# Patient Record
Sex: Female | Born: 1995 | Race: Black or African American | Hispanic: No | Marital: Single | Smoking: Never smoker
Health system: Southern US, Community
[De-identification: ages and names within clinical notes are randomized; demographics above are authoritative.]

## PROBLEM LIST (undated history)

## (undated) DIAGNOSIS — J45909 Unspecified asthma, uncomplicated: Secondary | ICD-10-CM

## (undated) DIAGNOSIS — R87629 Unspecified abnormal cytological findings in specimens from vagina: Secondary | ICD-10-CM

## (undated) HISTORY — PX: TONSILLECTOMY: SUR1361

## (undated) HISTORY — DX: Unspecified abnormal cytological findings in specimens from vagina: R87.629

---

## 2016-09-27 ENCOUNTER — Encounter (HOSPITAL_COMMUNITY): Payer: Self-pay | Admitting: Emergency Medicine

## 2016-09-27 ENCOUNTER — Ambulatory Visit (HOSPITAL_COMMUNITY)
Admission: EM | Admit: 2016-09-27 | Discharge: 2016-09-27 | Disposition: A | Discharge status: Home / Self Care | Attending: Family Medicine | Admitting: Family Medicine

## 2016-09-27 DIAGNOSIS — J029 Acute pharyngitis, unspecified: Secondary | ICD-10-CM

## 2016-09-27 MED ORDER — AMOXICILLIN 500 MG PO CAPS: 1000.0000 mg | ORAL_CAPSULE | Freq: Two times a day (BID) | ORAL | 0 refills | Status: DC

## 2016-09-27 NOTE — ED Triage Notes (Signed)
[facility] with a complaint of a sore throat x 3 days. The patient reported no fever at home.

## 2016-09-27 NOTE — ED Provider Notes (Signed)
CSN: [id number]     Arrival date & time 09/27/16  1005 History   First MD Initiated Contact with Patient 09/27/16 1048     Chief Complaint  Patient presents with  . Sore Throat   (Consider location/radiation/quality/duration/timing/severity/associated sxs/prior Treatment) 20 year old female complaining of a sore throat for 3 days. Denies cough, PND or known fever.      History reviewed. No pertinent past medical history. History reviewed. No pertinent surgical history. History reviewed. No pertinent family history. Social History  Substance Use Topics  . Smoking status: Never Smoker  . Smokeless tobacco: Never Used  . Alcohol use No   OB History    No data available     Review of Systems  Constitutional: Positive for activity change and fatigue. Negative for fever.  HENT: Negative for congestion, ear pain, postnasal drip and rhinorrhea.   Eyes: Negative.   Respiratory: Negative for cough and chest tightness.   Cardiovascular: Negative for chest pain and leg swelling.  Skin: Negative.  Negative for rash.  Neurological: Negative.     Allergies  Patient has no known allergies.  Home Medications   Prior to Admission medications   Medication Sig Start Date End Date Taking? Authorizing Provider  amoxicillin (AMOXIL) 500 MG capsule Take 2 capsules (1,000 mg total) by mouth 2 (two) times daily. 09/27/16   Hayden Rasmussenavid Kacy Conely, NP   Meds Ordered and Administered this Visit  Medications - No data to display  BP 117/83 (BP Location: Right Arm)   Pulse 115   Temp 100.2 F (37.9 C) (Oral)   Resp 17   LMP 09/25/2016 (Exact Date)   SpO2 98%  No data found.   Physical Exam  Constitutional: She is oriented to person, place, and time. She appears well-developed and well-nourished. No distress.  HENT:  Head: Normocephalic and atraumatic.  Mouth/Throat: Oropharyngeal exudate present.  Bilateral TMs are mildly retracted however no erythema or effusion.  Oropharynx with generalized  erythema, multiple exudates and mildly enlarged palatine tonsils.  Eyes: EOM are normal.  Neck: Normal range of motion. Neck supple.  Cardiovascular: Normal rate, regular rhythm and normal heart sounds.   Pulmonary/Chest: Breath sounds normal. She is in respiratory distress. She has no wheezes.  Musculoskeletal: Normal range of motion.  Lymphadenopathy:    She has cervical adenopathy.  Neurological: She is alert and oriented to person, place, and time.  Skin: Skin is warm and dry. No rash noted.  Nursing note and vitals reviewed.   Urgent Care Course   Clinical Course     Procedures (including critical care time)  Labs Review Labs Reviewed - No data to display  Imaging Review No results found.   Visual Acuity Review  Right Eye Distance:   Left Eye Distance:   Bilateral Distance:    Right Eye Near:   Left Eye Near:    Bilateral Near:         MDM   1. Exudative pharyngitis    You are being given amoxicillin for your sore throat. Drink plenty of fluids, stay well-hydrated, use Cepacol lozenges to help numb the throat. Take ibuprofen 600 mg every 6 hours as needed. Return instructions on sore throat. For worsening, new symptoms or problems may return. Meds ordered this encounter  Medications  . amoxicillin (AMOXIL) 500 MG capsule    Sig: Take 2 capsules (1,000 mg total) by mouth 2 (two) times daily.    Dispense:  30 capsule    Refill:  0    Order  Specific Question:   Supervising Provider    Answer:   Elvina SidleLAUENSTEIN, KURT [[id number]]       Hayden Rasmussenavid Sidnie Swalley, NP 09/27/16 1058

## 2016-09-27 NOTE — Discharge Instructions (Signed)
You are being given amoxicillin for your sore throat. Drink plenty of fluids, stay well-hydrated, use Cepacol lozenges to help numb the throat. Take ibuprofen 600 mg every 6 hours as needed. Return instructions on sore throat. For worsening, new symptoms or problems may return.

## 2016-11-18 ENCOUNTER — Ambulatory Visit (HOSPITAL_COMMUNITY)
Admission: EM | Admit: 2016-11-18 | Discharge: 2016-11-18 | Disposition: A | Discharge status: Home / Self Care | Attending: Family Medicine | Admitting: Family Medicine

## 2016-11-18 ENCOUNTER — Encounter (HOSPITAL_COMMUNITY): Payer: Self-pay | Admitting: Emergency Medicine

## 2016-11-18 DIAGNOSIS — Z79899 Other long term (current) drug therapy: Secondary | ICD-10-CM | POA: Insufficient documentation

## 2016-11-18 DIAGNOSIS — B9689 Other specified bacterial agents as the cause of diseases classified elsewhere: Secondary | ICD-10-CM | POA: Insufficient documentation

## 2016-11-18 DIAGNOSIS — N760 Acute vaginitis: Secondary | ICD-10-CM | POA: Diagnosis not present

## 2016-11-18 LAB — POCT URINALYSIS DIP (DEVICE)
Bilirubin Urine: NEGATIVE
Glucose, UA: NEGATIVE mg/dL
Ketones, ur: NEGATIVE mg/dL
LEUKOCYTES UA: NEGATIVE
NITRITE: NEGATIVE
PROTEIN: NEGATIVE mg/dL
SPECIFIC GRAVITY, URINE: 1.02 (ref 1.005–1.030)
UROBILINOGEN UA: 0.2 mg/dL (ref 0.0–1.0)
pH: 6 (ref 5.0–8.0)

## 2016-11-18 LAB — POCT PREGNANCY, URINE: Preg Test, Ur: NEGATIVE

## 2016-11-18 MED ORDER — METRONIDAZOLE 500 MG PO TABS: 500.0000 mg | ORAL_TABLET | Freq: Two times a day (BID) | ORAL | 0 refills | Status: DC

## 2016-11-18 NOTE — Discharge Instructions (Signed)
Take 1 Diflucan when you get home

## 2016-11-18 NOTE — ED Provider Notes (Signed)
[facility]    CSN: [id number] Arrival date & time: 11/18/16  1435     History   Chief Complaint Chief Complaint  Patient presents with  . Vaginal Discharge    HPI Sarah [name] is a 21 y.o. female.   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 She describes this as watery and green. She's having essentially no abdominal pain or fever.      History reviewed. No pertinent past medical history.  There are no active problems to display for this patient.   History reviewed. No pertinent surgical history.  OB History    No data available       Home Medications    Prior to Admission medications   Medication Sig Start Date End Date Taking? Authorizing Provider  albuterol (PROVENTIL HFA;VENTOLIN HFA) 108 (90 Base) MCG/ACT inhaler Inhale into the lungs every 6 (six) hours as needed for wheezing or shortness of breath.   Yes Historical Provider, MD  loratadine (CLARITIN) 10 MG tablet Take 10 mg by mouth daily.   Yes Historical Provider, MD  montelukast (SINGULAIR) 10 MG tablet Take 10 mg by mouth at bedtime.   Yes Historical Provider, MD  metroNIDAZOLE (FLAGYL) 500 MG tablet Take 1 tablet (500 mg total) by mouth 2 (two) times daily. 11/18/16   Elvina Sidle, MD    Family History History reviewed. No pertinent family history.  Social History Social History  Substance Use Topics  . Smoking status: Never Smoker  . Smokeless tobacco: Never Used  . Alcohol use No     Allergies   Patient has no known allergies.   Review of Systems Review of Systems  Constitutional: Negative.   HENT: Negative.   Respiratory: Negative.   Gastrointestinal: Negative.   Genitourinary: Positive for vaginal discharge. Negative for dyspareunia, dysuria and pelvic pain.     Physical Exam Triage Vital Signs ED Triage Vitals  Enc Vitals  Group     BP      Pulse      Resp      Temp      Temp src      SpO2      Weight      Height      Head Circumference      Peak Flow      Pain Score      Pain Loc      Pain Edu?      Excl. in GC?    No data found.   Updated Vital Signs LMP 11/06/2016    Physical Exam  Constitutional: She is oriented to person, place, and time. She appears well-developed and well-nourished.  HENT:  Right Ear: External ear normal.  Left Ear: External ear normal.  Mouth/Throat: Oropharynx is clear and moist.  Eyes: Conjunctivae are normal.  Neck: Normal range of motion. Neck supple.  Pulmonary/Chest: Effort normal.  Genitourinary: Vaginal discharge found.  Genitourinary Comments: No cervical motion tenderness, minimal discharge, no external genitalia abnormalities  Musculoskeletal: Normal range of motion.  Neurological: She is alert and oriented to person, place, and time.  Skin: Skin is warm and dry.  Nursing note and vitals reviewed.    UC Treatments / Results  Labs (all labs ordered are listed, but only abnormal results are displayed) Labs Reviewed  POCT URINALYSIS DIP (DEVICE) - Abnormal; Notable for the following:  Result Value   Hgb urine dipstick TRACE (*)    All other components within normal limits  POCT PREGNANCY, URINE  CERVICOVAGINAL ANCILLARY ONLY    EKG  EKG Interpretation None       Radiology No results found.  Procedures Procedures (including critical care time)  Medications Ordered in UC Medications - No data to display   Initial Impression / Assessment and Plan / UC Course  I have reviewed the triage vital signs and the nursing notes.  Pertinent labs & imaging results that were available during my care of the patient were reviewed by me and considered in my medical decision making (see chart for details).    Final Clinical Impressions(s) / UC Diagnoses   Final diagnoses:  BV (bacterial vaginosis)    New Prescriptions New  Prescriptions   METRONIDAZOLE (FLAGYL) 500 MG TABLET    Take 1 tablet (500 mg total) by mouth 2 (two) times daily.     Elvina SidleKurt Pernella Ackerley, MD 11/18/16 [phone]

## 2016-11-18 NOTE — ED Notes (Signed)
Verified phone number 

## 2016-11-18 NOTE — ED Triage Notes (Signed)
Here for yellowish vag d/c onset 1 month  Denies urinary sx, fevers, n/v, abd pain  SA in a monogamous relationship x2 years w/occasional condom use  A&O x4... NAD

## 2016-11-19 LAB — CERVICOVAGINAL ANCILLARY ONLY
Chlamydia: NEGATIVE
Neisseria Gonorrhea: NEGATIVE
Wet Prep (BD Affirm): POSITIVE — AB

## 2017-02-23 ENCOUNTER — Ambulatory Visit (HOSPITAL_COMMUNITY)
Admission: EM | Admit: 2017-02-23 | Discharge: 2017-02-23 | Disposition: A | Discharge status: Home / Self Care | Attending: Internal Medicine | Admitting: Internal Medicine

## 2017-02-23 ENCOUNTER — Encounter (HOSPITAL_COMMUNITY): Payer: Self-pay | Admitting: Family Medicine

## 2017-02-23 DIAGNOSIS — B349 Viral infection, unspecified: Secondary | ICD-10-CM | POA: Diagnosis not present

## 2017-02-23 DIAGNOSIS — M542 Cervicalgia: Secondary | ICD-10-CM

## 2017-02-23 DIAGNOSIS — M436 Torticollis: Secondary | ICD-10-CM

## 2017-02-23 NOTE — ED Triage Notes (Signed)
Pt here for cervical pain x 1 week. Denies injury. sts worse with movement. Pt in no acute distress.

## 2017-02-23 NOTE — ED Provider Notes (Signed)
[facility]    CSN: [id number] Arrival date & time: 02/23/17  1002     History   Chief Complaint Chief Complaint  Patient presents with  . Neck Pain    HPI Sarah [name] is a 21 y.o. female. She presents today with about a one-week history of stiffness and discomfort in the back of her neck. This is not getting worse, just isn't going away. A relative was recently diagnosed with meningitis, about 10 days ago, and she wanted to be checked. Relative was managed with ER diagnosis and discharge.  No fever. Slight headache, intermittent. Has not missed class. Taking finals, has been at the computer more often than usual. No unusual activities or trauma. No rash, no unusual joint pains or achiness elsewhere. Equivocal sore throat. No runny/congested nose, no cough. Decreased appetite but no nausea/vomiting. Has had some loose stools, which is not unusual for her.    HPI  History reviewed. No pertinent past medical history.  History reviewed. No pertinent surgical history.    Home Medications    Prior to Admission medications   Medication Sig Start Date End Date Taking? Authorizing Provider  albuterol (PROVENTIL HFA;VENTOLIN HFA) 108 (90 Base) MCG/ACT inhaler Inhale into the lungs every 6 (six) hours as needed for wheezing or shortness of breath.    [provider]  loratadine (CLARITIN) 10 MG tablet Take 10 mg by mouth daily.    [provider]  metroNIDAZOLE (FLAGYL) 500 MG tablet Take 1 tablet (500 mg total) by mouth 2 (two) times daily. 11/18/16   Elvina SidleLauenstein, Kurt, MD  montelukast (SINGULAIR) 10 MG tablet Take 10 mg by mouth at bedtime.    [provider]    Family History History reviewed. No pertinent family history.  Social History Social History  Substance Use Topics  . Smoking status: Never Smoker  . Smokeless tobacco: Never Used  . Alcohol use No     Allergies   Patient has no known allergies.   Review of  Systems Review of Systems  All other systems reviewed and are negative.    Physical Exam Triage Vital Signs ED Triage Vitals [02/23/17 1025]  Enc Vitals Group     BP 113/65     Pulse Rate 77     Resp 16     Temp 98.3 F (36.8 C)     Temp src      SpO2 97 %     Weight      Height      Pain Score 7     Pain Loc    Updated Vital Signs BP 113/65   Pulse 77   Temp 98.3 F (36.8 C)   Resp 16   SpO2 97%   Physical Exam  Constitutional: She is oriented to person, place, and time. No distress.  HENT:  Head: Atraumatic.  Bilateral TMs are translucent, no erythema No significant nasal congestion Throat is slightly injected, tonsils are not enlarged  Eyes:  Conjugate gaze observed, no eye redness/discharge  Neck: Neck supple.  Able to fully flex neck forward, but it is uncomfortable. Range of motion is otherwise full and pain free.  Cardiovascular: Normal rate and regular rhythm.   Pulmonary/Chest: No respiratory distress. She has no wheezes. She has no rales.  Abdominal: She exhibits no distension.  Musculoskeletal: Normal range of motion.  Neurological: She is alert and oriented to person, place, and time.  Skin: Skin is warm and dry.  Nursing note and vitals reviewed.  UC Treatments / Results   Procedures Procedures (including critical care time) None today  Final Clinical Impressions(s) / UC Diagnoses   Final diagnoses:  Nonspecific syndrome suggestive of viral illness  Acute muscle stiffness of neck   No danger signs on exam.  Anticipate gradual improvement over the next week or so in neck discomfort.  Ibuprofen 3-4 tabs 3 times daily should help with pain.  Ice for 5-10 minutes several times daily should also help.  Recheck for fever >100.5, marked increase in neck pain/stiffness, new rash, new joint pains/swelling.     Eustace Moore, MD 02/27/17 [phone]

## 2017-02-23 NOTE — Discharge Instructions (Addendum)
No danger signs on exam.  Anticipate gradual improvement over the next week or so in neck discomfort.  Ibuprofen 3-4 tabs 3 times daily should help with pain.  Ice for 5-10 minutes several times daily should also help.  Recheck for fever >100.5, marked increase in neck pain/stiffness, new rash, new joint pains/swelling.

## 2018-08-21 ENCOUNTER — Ambulatory Visit (HOSPITAL_COMMUNITY)
Admission: EM | Admit: 2018-08-21 | Discharge: 2018-08-21 | Disposition: A | Discharge status: Home / Self Care | Attending: Family Medicine | Admitting: Family Medicine

## 2018-08-21 ENCOUNTER — Encounter (HOSPITAL_COMMUNITY): Payer: Self-pay | Admitting: Emergency Medicine

## 2018-08-21 DIAGNOSIS — M542 Cervicalgia: Secondary | ICD-10-CM | POA: Diagnosis not present

## 2018-08-21 DIAGNOSIS — M7918 Myalgia, other site: Secondary | ICD-10-CM | POA: Diagnosis not present

## 2018-08-21 MED ORDER — TIZANIDINE HCL 4 MG PO TABS: 4.0000 mg | ORAL_TABLET | Freq: Four times a day (QID) | ORAL | 0 refills | Status: DC | PRN

## 2018-08-21 MED ORDER — IBUPROFEN 800 MG PO TABS: 800.0000 mg | ORAL_TABLET | Freq: Three times a day (TID) | ORAL | 0 refills | Status: DC

## 2018-08-21 NOTE — ED Triage Notes (Signed)
Pt c/o neck pain, states when she turns to look to the left it hurts. Pain x1 month.

## 2018-08-21 NOTE — ED Provider Notes (Signed)
[facility]    CSN: [id number] Arrival date & time: 08/21/18  1206     History   Chief Complaint Chief Complaint  Patient presents with  . Neck Pain    HPI Sarah [name] is a 22 y.o. female.   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  She is here for neck pain.  Is only on the left side of her neck.  She states she "slept wrong".  Been present for a month.  She went to her PCP.  They ordered physical therapy.  She never went.,  Had to come back to school.  The pain is moderate.  Hurts worse with movement.  It does make it difficult to sleep at night.  No numbness or weakness in arms or legs.  No prior history of neck problems.  No trauma or accident.  Sometimes it causes her to have a headache in the back of her head.  History reviewed. No pertinent past medical history.  There are no active problems to display for this patient.   History reviewed. No pertinent surgical history.  OB History   None      Home Medications    Prior to Admission medications   Medication Sig Start Date End Date Taking? Authorizing Provider  albuterol (PROVENTIL HFA;VENTOLIN HFA) 108 (90 Base) MCG/ACT inhaler Inhale into the lungs every 6 (six) hours as needed for wheezing or shortness of breath.    [provider]  ibuprofen (ADVIL,MOTRIN) 800 MG tablet Take 1 tablet (800 mg total) by mouth 3 (three) times daily. 08/21/18   Eustace Moore, MD  loratadine (CLARITIN) 10 MG tablet Take 10 mg by mouth daily.    [provider]  montelukast (SINGULAIR) 10 MG tablet Take 10 mg by mouth at bedtime.    [provider]  tiZANidine (ZANAFLEX) 4 MG tablet Take 1 tablet (4 mg total) by mouth every 6 (six) hours as needed for muscle spasms. 08/21/18   Eustace Moore, MD    Family History Family History  Problem Relation Age of Onset  . Healthy Mother   . Healthy Father     Social History Social History   Tobacco Use  . Smoking status:  Never Smoker  . Smokeless tobacco: Never Used  Substance Use Topics  . Alcohol use: No  . Drug use: No     Allergies   Patient has no known allergies.   Review of Systems Review of Systems  Constitutional: Negative for chills and fever.  HENT: Negative for ear pain and sore throat.   Eyes: Negative for pain and visual disturbance.  Respiratory: Negative for cough and shortness of breath.   Cardiovascular: Negative for chest pain and palpitations.  Gastrointestinal: Negative for abdominal pain and vomiting.  Genitourinary: Negative for dysuria and hematuria.  Musculoskeletal: Positive for neck pain and neck stiffness. Negative for arthralgias and back pain.  Skin: Negative for color change and rash.  Neurological: Negative for seizures, syncope, weakness and numbness.  All other systems reviewed and are negative.    Physical Exam Triage Vital Signs ED Triage Vitals  Enc Vitals Group     BP 08/21/18 1321 120/86     Pulse Rate 08/21/18 1321 72     Resp 08/21/18 1321 16     Temp 08/21/18 1321 98.2 F (36.8 C)     Temp src --      SpO2 08/21/18 1321 100 %     Weight --  Height --      Head Circumference --      Peak Flow --      Pain Score 08/21/18 1322 7     Pain Loc --      Pain Edu? --      Excl. in GC? --    No data found.  Updated Vital Signs BP 120/86   Pulse 72   Temp 98.2 F (36.8 C)   Resp 16   LMP 07/21/2018   SpO2 100%       Physical Exam  Constitutional: She appears well-developed and well-nourished. No distress.  HENT:  Head: Normocephalic and atraumatic.  Mouth/Throat: Oropharynx is clear and moist.  Eyes: Pupils are equal, round, and reactive to light. Conjunctivae are normal.  Neck: Normal range of motion.  Appears mildly uncomfortable.  Has limited rotation towards the left.  Otherwise range of motion is slow but full  Cardiovascular: Normal rate.  Pulmonary/Chest: Effort normal. No respiratory distress.  Abdominal: Soft. She  exhibits no distension.  Musculoskeletal: Normal range of motion. She exhibits no edema.  Strength sensation range of motion and reflexes are symmetric in both upper extremities  Neurological: She is alert.  Skin: Skin is warm and dry.  Psychiatric: She has a normal mood and affect. Her behavior is normal.     UC Treatments / Results  Labs (all labs ordered are listed, but only abnormal results are displayed) Labs Reviewed - No data to display  EKG None  Radiology No results found.  Procedures Procedures (including critical care time)  Medications Ordered in UC Medications - No data to display  Initial Impression / Assessment and Plan / UC Course  I have reviewed the triage vital signs and the nursing notes.  Pertinent labs & imaging results that were available during my care of the patient were reviewed by me and considered in my medical decision making (see chart for details).     Recommend conservative treatment of neck pain.  This is muscular pain.  No evidence of nerve inflammation.  No history of trauma.  X-rays not indicated.  Will treat with medication.  Demonstrated range of motion exercises.  Return as needed Final Clinical Impressions(s) / UC Diagnoses   Final diagnoses:  Musculoskeletal pain  Neck pain     Discharge Instructions     Take ibuprofen 3 times a day with food. In addition take muscle relaxer as needed.  Muscle relaxer is useful at bedtime.  Caution drowsiness, driving if taking the muscle relaxer. Use ice or heat to the neck muscles. Gentle range of motion and stretching.  Do this twice a day. Follow-up with your PCP if not improving in a week or 2   ED Prescriptions    Medication Sig Dispense Auth. Provider   ibuprofen (ADVIL,MOTRIN) 800 MG tablet Take 1 tablet (800 mg total) by mouth 3 (three) times daily. 21 tablet Eustace Moore, MD   tiZANidine (ZANAFLEX) 4 MG tablet Take 1 tablet (4 mg total) by mouth every 6 (six) hours as  needed for muscle spasms. 30 tablet Eustace Moore, MD     Controlled Substance Prescriptions [place] Controlled Substance Registry consulted? Not Applicable   Eustace Moore, MD 08/21/18 1344

## 2018-08-21 NOTE — Discharge Instructions (Signed)
Take ibuprofen 3 times a day with food. In addition take muscle relaxer as needed.  Muscle relaxer is useful at bedtime.  Caution drowsiness, driving if taking the muscle relaxer. Use ice or heat to the neck muscles. Gentle range of motion and stretching.  Do this twice a day. Follow-up with your PCP if not improving in a week or 2

## 2020-05-07 ENCOUNTER — Emergency Department (HOSPITAL_COMMUNITY): Payer: 59

## 2020-05-07 ENCOUNTER — Encounter (HOSPITAL_COMMUNITY): Payer: Self-pay | Admitting: Emergency Medicine

## 2020-05-07 ENCOUNTER — Other Ambulatory Visit: Payer: Self-pay | Admitting: Family Medicine

## 2020-05-07 ENCOUNTER — Emergency Department (HOSPITAL_COMMUNITY)
Admission: EM | Admit: 2020-05-07 | Discharge: 2020-05-07 | Disposition: A | Discharge status: Home / Self Care | Payer: 59 | Attending: Emergency Medicine | Admitting: Emergency Medicine

## 2020-05-07 ENCOUNTER — Other Ambulatory Visit: Payer: Self-pay

## 2020-05-07 DIAGNOSIS — Z79899 Other long term (current) drug therapy: Secondary | ICD-10-CM | POA: Diagnosis not present

## 2020-05-07 DIAGNOSIS — N760 Acute vaginitis: Secondary | ICD-10-CM | POA: Diagnosis not present

## 2020-05-07 DIAGNOSIS — R102 Pelvic and perineal pain: Secondary | ICD-10-CM | POA: Diagnosis not present

## 2020-05-07 DIAGNOSIS — J45909 Unspecified asthma, uncomplicated: Secondary | ICD-10-CM | POA: Diagnosis not present

## 2020-05-07 DIAGNOSIS — O30041 Twin pregnancy, dichorionic/diamniotic, first trimester: Secondary | ICD-10-CM

## 2020-05-07 DIAGNOSIS — O3680X0 Pregnancy with inconclusive fetal viability, not applicable or unspecified: Secondary | ICD-10-CM

## 2020-05-07 DIAGNOSIS — Z3A01 Less than 8 weeks gestation of pregnancy: Secondary | ICD-10-CM | POA: Diagnosis not present

## 2020-05-07 DIAGNOSIS — O99891 Other specified diseases and conditions complicating pregnancy: Secondary | ICD-10-CM | POA: Diagnosis not present

## 2020-05-07 DIAGNOSIS — R109 Unspecified abdominal pain: Secondary | ICD-10-CM

## 2020-05-07 DIAGNOSIS — O99511 Diseases of the respiratory system complicating pregnancy, first trimester: Secondary | ICD-10-CM | POA: Insufficient documentation

## 2020-05-07 HISTORY — DX: Unspecified asthma, uncomplicated: J45.909

## 2020-05-07 LAB — URINALYSIS, ROUTINE W REFLEX MICROSCOPIC
Bacteria, UA: NONE SEEN
Bilirubin Urine: NEGATIVE
Glucose, UA: NEGATIVE mg/dL
Ketones, ur: 80 mg/dL — AB
Leukocytes,Ua: NEGATIVE
Nitrite: NEGATIVE
Protein, ur: NEGATIVE mg/dL
Specific Gravity, Urine: 1.016 (ref 1.005–1.030)
pH: 5 (ref 5.0–8.0)

## 2020-05-07 LAB — COMPREHENSIVE METABOLIC PANEL
ALT: 14 U/L (ref 0–44)
AST: 15 U/L (ref 15–41)
Albumin: 4.7 g/dL (ref 3.5–5.0)
Alkaline Phosphatase: 49 U/L (ref 38–126)
Anion gap: 9 (ref 5–15)
BUN: 10 mg/dL (ref 6–20)
CO2: 26 mmol/L (ref 22–32)
Calcium: 9.6 mg/dL (ref 8.9–10.3)
Chloride: 104 mmol/L (ref 98–111)
Creatinine, Ser: 0.67 mg/dL (ref 0.44–1.00)
GFR calc Af Amer: >60 mL/min (ref >60)
GFR calc non Af Amer: >60 mL/min (ref >60)
Glucose, Bld: 80 mg/dL (ref 70–99)
Potassium: 3.7 mmol/L (ref 3.5–5.1)
Sodium: 139 mmol/L (ref 135–145)
Total Bilirubin: 1 mg/dL (ref 0.3–1.2)
Total Protein: 7.6 g/dL (ref 6.5–8.1)

## 2020-05-07 LAB — CBC
HCT: 40.3 % (ref 36.0–46.0)
Hemoglobin: 13.5 g/dL (ref 12.0–15.0)
MCH: 29.7 pg (ref 26.0–34.0)
MCHC: 33.5 g/dL (ref 30.0–36.0)
MCV: 88.6 fL (ref 80.0–100.0)
Platelets: 345 10*3/uL (ref 150–400)
RBC: 4.55 MIL/uL (ref 3.87–5.11)
RDW: 13.1 % (ref 11.5–15.5)
WBC: 6.4 10*3/uL (ref 4.0–10.5)
nRBC: 0 % (ref 0.0–0.2)

## 2020-05-07 LAB — WET PREP, GENITAL
Sperm: NONE SEEN
Trich, Wet Prep: NEGATIVE — AB
Yeast Wet Prep HPF POC: NEGATIVE — AB

## 2020-05-07 LAB — I-STAT BETA HCG BLOOD, ED (MC, WL, AP ONLY): I-stat hCG, quantitative: >2000 m[IU]/mL — ABNORMAL HIGH (ref <5)

## 2020-05-07 LAB — LIPASE, BLOOD: Lipase: 24 U/L (ref 11–51)

## 2020-05-07 LAB — HCG, QUANTITATIVE, PREGNANCY: hCG, Beta Chain, Quant, S: 15366 m[IU]/mL — ABNORMAL HIGH (ref <5)

## 2020-05-07 MED ORDER — SODIUM CHLORIDE 0.9% FLUSH: 3.0000 mL | Freq: Once | INTRAVENOUS | Status: DC

## 2020-05-07 MED ORDER — METRONIDAZOLE 500 MG PO TABS
500.0000 mg | ORAL_TABLET | Freq: Two times a day (BID) | ORAL | 0 refills | Status: DC
Start: 2020-05-07 — End: 2020-05-19

## 2020-05-07 NOTE — Progress Notes (Signed)
   OB/GYN Telephone Consult  05/07/20   Sarah [name] is a 24 y.o. G1P0 currently at [redacted]w[redacted]d presenting to [facility]. I was called for a consult regarding the care of this patient by [facility]                                           .    The provider had a clinical question about questions about follow up for pregnancy and ovarian cyst in the setting of mild abdominal cramping  The provider presented the following relevant clinical information: - bHCG - [place] results  I performed a chart review on the patient and reviewed available documentation.  There were no vitals taken for this visit.  Exam- performed by consulting provider   Recommendations:  -Recommend ER provider get quantitative bHCG -Repeat bHCG at [facility] Friday AM -Repeat US in 10-14 days to review pregnancy progression -Ovarian cyst-- seen on [place] is larger than expected for a corpus luteum cyst. Recommended torsion protocol and counseling given large size. Could be hemorrhagic cyst.  -Recommended APP provide the patient with a referral to the [facility] (any office) for follow up in  4 weeks to start prenatal care - I am personally messaging the [facility] to have the patient come for RN visit with stat bHCG on Friday  Thank you for this consult and if additional recommendations are needed please call [phone] for the OB/GYN attending on service at [facility].   I spent approximately 5 minutes directly consulting with the provider and verbally discussing this case. Additionally 5 minutes minutes was spent performing chart review and documentation.   Federico Flake, MD   Criteria for phone consult billing? (If answer to any of these are yes then you cannot bill this telephone consult) Will the patient be seen urgently (within 24hrs) at a [facility] practice? No Is this a patient on which I performed surgery within the last 7d? No Have you billed a  telephone consult on this patient in the last 7d? No  CPT Code (based on total time, direct and indirect) 99441= 5-10 min

## 2020-05-07 NOTE — ED Notes (Addendum)
Pt states minor cramping in pregnancy. Pt states she went to a women's health facility, who told her one of her eggs had no yolk, other one did have yolk. Denies any severe cramping or pain. Pt states she was encouraged to come for second opinion.

## 2020-05-07 NOTE — ED Triage Notes (Signed)
Pt complaint of 7/10 abdominal cramping without vaginal bleeding/discharge. Was told to come to ER by [place] related to "another ball seen on the ultrasound." Pt verbalizes is [redacted] weeks pregnant.

## 2020-05-07 NOTE — ED Notes (Signed)
Pt ambulatory to restroom

## 2020-05-07 NOTE — Discharge Instructions (Addendum)
As we discussed, your ultrasound showed evidence of twin pregnancy.  Additionally, there was evidence of a right ovarian cyst.  As we discussed, you will need to follow-up with the referred med Center [facility] for repeat ultrasound and repeat pregnancy test.  They will notify you.  If you have not heard from them in the next 2 to 3 days, call their office and confirm an appointment.  I provided you Flagyl for treatment of BV.  Return to the emergency department for any severe abdominal pain, vaginal bleeding, vomiting that does not stop or any other worsening or concerning symptoms.

## 2020-05-07 NOTE — ED Provider Notes (Signed)
[name]-EMERGENCY DEPT Provider Note   CSN: [id number] Arrival date & time: 05/07/20  1157     History Chief Complaint  Patient presents with  . Abdominal Pain  . Possible Pregnancy    5 weeks    Sarah [name] is a 24 y.o. female who presents for evaluation of abdominal cramping and abnormal ultrasound.  Patient reports that she is approximately 5 weeks and 1 day pregnant.  She states that this is her first pregnancy.  She had had some abdominal cramping over the last 3 to 4 days.  She went to a health and her clinic today and had an ultrasound which was abnormal.  She was told "there is an extra ball" and that she needed to go to the emergency department.  She is not having any vaginal bleeding.  She states the abdominal cramping is intermittent.  She has had some associated nausea/vomiting.  No fevers.  She has not yet seen OB/GYN.  She denies any fever, chest pain, difficulty breathing, dysuria, hematuria, vaginal bleeding.   The history is provided by the patient.       Past Medical History:  Diagnosis Date  . Asthma     There are no problems to display for this patient.   Past Surgical History:  Procedure Laterality Date  . TONSILLECTOMY       OB History    Gravida  1   Para      Term      Preterm      AB      Living        SAB      TAB      Ectopic      Multiple      Live Births              Family History  Problem Relation Age of Onset  . Healthy Mother   . Healthy Father     Social History   Tobacco Use  . Smoking status: Never Smoker  . Smokeless tobacco: Never Used  Substance Use Topics  . Alcohol use: No  . Drug use: No    Home Medications Prior to Admission medications   Medication Sig Start Date End Date Taking? Authorizing Provider  albuterol (PROVENTIL HFA;VENTOLIN HFA) 108 (90 Base) MCG/ACT inhaler Inhale into the lungs every 6 (six) hours as needed for wheezing or shortness of breath.    Yes [provider]  cetirizine (ZYRTEC) 10 MG tablet Take 10 mg by mouth daily as needed for allergies.   Yes [provider]  montelukast (SINGULAIR) 10 MG tablet Take 10 mg by mouth at bedtime.   Yes [provider]  ibuprofen (ADVIL,MOTRIN) 800 MG tablet Take 1 tablet (800 mg total) by mouth 3 (three) times daily. Patient not taking: Reported on 05/07/2020 08/21/18   Eustace Moore, MD  loratadine (CLARITIN) 10 MG tablet Take 10 mg by mouth daily. Patient not taking: Reported on 05/07/2020    [provider]  metroNIDAZOLE (FLAGYL) 500 MG tablet Take 1 tablet (500 mg total) by mouth 2 (two) times daily. 05/07/20   Maxwell Caul, PA-C  tiZANidine (ZANAFLEX) 4 MG tablet Take 1 tablet (4 mg total) by mouth every 6 (six) hours as needed for muscle spasms. Patient not taking: Reported on 05/07/2020 08/21/18   Eustace Moore, MD    Allergies    Patient has no known allergies.  Review of Systems   Review of  Systems  Constitutional: Negative for fever.  Respiratory: Negative for cough and shortness of breath.   Cardiovascular: Negative for chest pain.  Gastrointestinal: Positive for abdominal pain. Negative for nausea and vomiting.  Genitourinary: Negative for dysuria and hematuria.  Neurological: Negative for headaches.  All other systems reviewed and are negative.   Physical Exam Updated Vital Signs BP 125/70   Pulse 67   Temp 98 F (36.7 C) (Oral)   Resp 16   SpO2 100%   Physical Exam Vitals and nursing note reviewed. Exam conducted with a chaperone present.  Constitutional:      Appearance: Normal appearance. She is well-developed.  HENT:     Head: Normocephalic and atraumatic.  Eyes:     General: Lids are normal.     Conjunctiva/sclera: Conjunctivae normal.     Pupils: Pupils are equal, round, and reactive to light.  Cardiovascular:     Rate and Rhythm: Normal rate and regular rhythm.     Pulses: Normal pulses.     Heart  sounds: Normal heart sounds. No murmur heard.  No friction rub. No gallop.   Pulmonary:     Effort: Pulmonary effort is normal.     Breath sounds: Normal breath sounds.  Abdominal:     Palpations: Abdomen is soft. Abdomen is not rigid.     Tenderness: There is abdominal tenderness in the right lower quadrant and suprapubic area. There is no guarding.     Comments: Abdominal soft, nondistended.  Tenderness palpation in the right lower quadrant and suprapubic region.  No rigidity, guarding.  No CVA tenderness noted bilaterally.  Genitourinary:    Comments: The exam was performed with a chaperone present. Normal external female genitalia. No lesions, rash, or sores.  No CMT.  Cervical os appears closed.  Positive Chadwick sign.  No adnexal mass or tenderness noted bilaterally. Musculoskeletal:        General: Normal range of motion.     Cervical back: Full passive range of motion without pain.  Skin:    General: Skin is warm and dry.     Capillary Refill: Capillary refill takes less than 2 seconds.  Neurological:     Mental Status: She is alert and oriented to person, place, and time.  Psychiatric:        Speech: Speech normal.     ED Results / Procedures / Treatments   Labs (all labs ordered are listed, but only abnormal results are displayed) Labs Reviewed  WET PREP, GENITAL - Abnormal; Notable for the following components:      Result Value   Yeast Wet Prep HPF POC NEGATIVE (*)    Trich, Wet Prep NEGATIVE (*)    Clue Cells Wet Prep HPF POC PRESENT (*)    WBC, Wet Prep HPF POC FEW (*)    All other components within normal limits  URINALYSIS, ROUTINE W REFLEX MICROSCOPIC - Abnormal; Notable for the following components:   Hgb urine dipstick MODERATE (*)    Ketones, ur 80 (*)    All other components within normal limits  HCG, QUANTITATIVE, PREGNANCY - Abnormal; Notable for the following components:   hCG, Beta Chain, Quant, S 15,366 (*)    All other components within normal  limits  I-STAT BETA HCG BLOOD, ED ([facility], [facility], [facility] ONLY) - Abnormal; Notable for the following components:   I-stat hCG, quantitative >2,000.0 (*)    All other components within normal limits  LIPASE, BLOOD  COMPREHENSIVE METABOLIC PANEL  CBC  GC/CHLAMYDIA PROBE  AMP ([place]) NOT AT [facility]    EKG None  Radiology US OB Comp < 14 Wks  Result Date: 05/07/2020 CLINICAL DATA:  Abdominal pain for 1 day EXAM: TWIN OBSTETRICAL ULTRASOUND <14 WKS COMPARISON:  None. FINDINGS: Number of IUPs:  2 Chorionicity/Amnionicity:  Dichorionic-diamniotic (thick membrane) TWIN 1 Yolk sac:  Not Visualized. Embryo:  Not Visualized. Cardiac Activity: Not Visualized. MSD: 10.7 mm   5 w   6 d TWIN 2 Yolk sac:  Not Visualized. Embryo:  Not Visualized. Cardiac Activity: Not Visualized. MSD: 5.5 mm   5 w   2 d Subchorionic hemorrhage:  None visualized. Maternal uterus/adnexae: Large cystic structure in the right adnexa with demonstrable peripheral color Doppler flow could reflect a large corpus luteum cyst measuring 6.4 x 6.4 x 6.9 cm. Left ovary has a more normal appearance. Trace fluid in the posterior cul-de-sac. IMPRESSION: Probable dichorionic-diamniotic twin gestational sacs, but no yolk sac, fetal pole, or cardiac activity yet visualized. Asymmetry of the gestational sacs is a nonspecific finding and likely more pronounced given early stage of gestation. Per sonographic dating minimal discrepancy in gestational age by MSD estimation. Recommend follow-up quantitative B-HCG levels and follow-up US in 14 days to assess viability. This recommendation follows SRU consensus guidelines: Diagnostic Criteria for Nonviable Pregnancy Early in the First Trimester. Malva Limes Med 2013; 188:4166-06. Large cystic structure in the right ovary with some peripheral color flow suggestive of a large corpus luteum cyst measuring up to 6.9 cm in size. Trace anechoic free fluid in the cul-de-sac, likely physiologic. Electronically Signed   By: Kreg Shropshire M.D.   On: 05/07/2020 16:41   US OB Transvaginal  Result Date: 05/07/2020 CLINICAL DATA:  Abdominal pain for 1 day EXAM: TWIN OBSTETRICAL ULTRASOUND <14 WKS COMPARISON:  None. FINDINGS: Number of IUPs:  2 Chorionicity/Amnionicity:  Dichorionic-diamniotic (thick membrane) TWIN 1 Yolk sac:  Not Visualized. Embryo:  Not Visualized. Cardiac Activity: Not Visualized. MSD: 10.7 mm   5 w   6 d TWIN 2 Yolk sac:  Not Visualized. Embryo:  Not Visualized. Cardiac Activity: Not Visualized. MSD: 5.5 mm   5 w   2 d Subchorionic hemorrhage:  None visualized. Maternal uterus/adnexae: Large cystic structure in the right adnexa with demonstrable peripheral color Doppler flow could reflect a large corpus luteum cyst measuring 6.4 x 6.4 x 6.9 cm. Left ovary has a more normal appearance. Trace fluid in the posterior cul-de-sac. IMPRESSION: Probable dichorionic-diamniotic twin gestational sacs, but no yolk sac, fetal pole, or cardiac activity yet visualized. Asymmetry of the gestational sacs is a nonspecific finding and likely more pronounced given early stage of gestation. Per sonographic dating minimal discrepancy in gestational age by MSD estimation. Recommend follow-up quantitative B-HCG levels and follow-up US in 14 days to assess viability. This recommendation follows SRU consensus guidelines: Diagnostic Criteria for Nonviable Pregnancy Early in the First Trimester. Malva Limes Med 2013; 301:6010-93. Large cystic structure in the right ovary with some peripheral color flow suggestive of a large corpus luteum cyst measuring up to 6.9 cm in size. Trace anechoic free fluid in the cul-de-sac, likely physiologic. Electronically Signed   By: Kreg Shropshire M.D.   On: 05/07/2020 16:41    Procedures Procedures (including critical care time)  Medications Ordered in ED Medications - No data to display  ED Course  I have reviewed the triage vital signs and the nursing notes.  Pertinent labs & imaging results that were  available during my care of the patient  were reviewed by me and considered in my medical decision making (see chart for details).    MDM Rules/Calculators/A&P                          24 year old female who presents for evaluation of abdominal cramping.  She reports that she is currently pregnant.  Estimates gestational age of [redacted] weeks 1 day.  She went to a health clinic earlier today to get an ultrasound and was told that there was "an abnormal ball."  She was sent to the ED.  She does endorse abdominal cramping.  This is her first pregnancy.  She has not yet seen OB/GYN care.  No vaginal beating.  On initial arrival, she is afebrile nontoxic-appearing.  Vital signs are stable.  On exam, she has some mild tenderness into the right lower quadrant and suprapubic area.  Concern for ectopic versus abdominal cramping associated with pregnancy.  History/physical exam not concerning for appendicitis.  She has not had any nausea/vomiting and denies any fever.  Labs, ultrasound ordered at triage.  I-STAT beta is greater than 2000.  CMP shows no abnormalities.  UA shows moderate hemoglobin.  Otherwise no infectious etiology.  Lipase is normal.  CBC shows no leukocytosis or anemia.  Pelvic exam as documented above.  No CMT that would be concerning for PID.  She did have some right adnexal tenderness.  No left adnexal tenderness.  Cervical os is closed.  No bleeding.  Positive Chadwick sign.  Ultrasound shows probable dichorionic diamniotic twin gestational sacs.  No yolk sac, no fetal pole or cardiac cavity visualized.  There is asymmetry of the gestational sacs.  There is also mention of large cystic structure in the right ovary with some peripheral color flow suggestive of a large corpus luteum cyst measuring 6.9 cm.  Discussed patient with Dr. Alvester MorinNewton (OB/GYN).  She recommends obtaining repeat ultrasounnd with Doppler to ensure that there is no torsion.  Other findings are not suggestive of ectopic pregnancy.   She recommends outpatient OB/GYN follow-up with repeat ultrasound in 10 to 14 days as well as repeat beta.  [facility] and additionally request that patient be given information to call them.  U/S requested reevaluation of the ultrasound as they do not typically do torsion studies in pregnant women.  They did add Doppler flow to see that there was color to the ovary which was confirmed in prior reading.   I discussed this with Dr. Alvester MorinNewton (OB/GYN) who agreed with plan.  Wet prep does show clue cells, negative for yeast, negative for trichomonas.  I discussed with patient.  We will plan to treat with Flagyl per Up to Date recommendations. Garey Ham.  Beta quant is added.  I instructed patient that she will [facility] for further evaluation and repeat ultrasound in 10 to 14 days.  Additionally, she will need to have her beta rechecked. At this time, patient exhibits no emergent life-threatening condition that require further evaluation in ED or admission.  History/physical exam is not concerning for appendicitis.  She has no leukocytosis.  She has not had any any fever and does not have any focal tenderness McBurney's point but rather generalized tenderness consistent with the side that her ovarian cyst is on.  I suspect her pain is most likely from the ovarian cyst.  Patient had ample opportunity for questions and discussion. All patient's  questions were answered with full understanding. Strict return precautions discussed. Patient expresses understanding and agreement to plan.   Portions of this note were generated with Scientist, clinical (histocompatibility and immunogenetics). Dictation errors may occur despite best attempts at proofreading.   Final Clinical Impression(s) / ED Diagnoses Final diagnoses:  Abdominal pain during pregnancy in first trimester  BV (bacterial vaginosis)    Rx / DC Orders ED Discharge Orders         Ordered     metroNIDAZOLE (FLAGYL) 500 MG tablet  2 times daily     Discontinue  Reprint     05/07/20 1831           Maxwell Caul, PA-C 05/07/20 2150    Terrilee Files, MD 05/08/20 1517

## 2020-05-08 ENCOUNTER — Other Ambulatory Visit: Payer: Self-pay | Admitting: Obstetrics and Gynecology

## 2020-05-08 ENCOUNTER — Other Ambulatory Visit: Payer: Self-pay | Admitting: Medical

## 2020-05-08 DIAGNOSIS — O3680X2 Pregnancy with inconclusive fetal viability, fetus 2: Secondary | ICD-10-CM

## 2020-05-08 LAB — GC/CHLAMYDIA PROBE AMP (~~LOC~~) NOT AT ARMC
Chlamydia: NEGATIVE
Comment: NEGATIVE
Comment: NORMAL
Neisseria Gonorrhea: NEGATIVE

## 2020-05-08 NOTE — Progress Notes (Signed)
Orders placed.

## 2020-05-09 ENCOUNTER — Other Ambulatory Visit: Payer: Self-pay

## 2020-05-09 ENCOUNTER — Ambulatory Visit (INDEPENDENT_AMBULATORY_CARE_PROVIDER_SITE_OTHER): Payer: 59 | Admitting: *Deleted

## 2020-05-09 VITALS — BP 114/65 | HR 73

## 2020-05-09 DIAGNOSIS — Z712 Person consulting for explanation of examination or test findings: Secondary | ICD-10-CM

## 2020-05-09 DIAGNOSIS — O2690 Pregnancy related conditions, unspecified, unspecified trimester: Secondary | ICD-10-CM

## 2020-05-09 DIAGNOSIS — O3680X2 Pregnancy with inconclusive fetal viability, fetus 2: Secondary | ICD-10-CM

## 2020-05-09 LAB — BETA HCG QUANT (REF LAB): hCG Quant: 18508 m[IU]/mL

## 2020-05-09 NOTE — Progress Notes (Signed)
Patient was assessed and managed by nursing staff during this encounter. I have reviewed the chart and agree with the documentation and plan. I have also made any necessary editorial changes.  Jaynie Collins, MD 05/09/2020 1:01 PM

## 2020-05-09 NOTE — Progress Notes (Signed)
Pt here for stat BHCG. She reports some mild cramping however denies having vaginal bleeding. Pt was advised to go to MAU @ [facility] if she develops heavy vaginal bleeding or severe abdominal pain. Pt is considering termination of pregnancy but has not made a definite decision. Pt will be called with test results today. She stated that a detailed message can be left on her voicemail if she does not answer. Pt vocied understanding of all information and instructions given.   1240  BHCG results and pt history reviewed by Dr. Macon Large who finds appropriate rise in BHCG. Pt does not require further testing of BHCG. She should keep appt for follow up US as scheduled on 8/2 and begin prenatal care on 8/30 as scheduled. I called pt and informed her of BHCG results. I advised that unless she chooses to terminate the pregnancy, she should keep her scheduled appts for [place] and to begin prenatal care. Pt was provided with reassurance that the cyst identified on her previous ultrasound is within normal limits given that she has a twin pregnancy. She voiced understanding of all information and instructions given.

## 2020-05-14 ENCOUNTER — Inpatient Hospital Stay (HOSPITAL_COMMUNITY)
Admission: AD | Admit: 2020-05-14 | Discharge: 2020-05-15 | Disposition: A | Discharge status: Home / Self Care | Payer: 59 | Attending: Obstetrics and Gynecology | Admitting: Obstetrics and Gynecology

## 2020-05-14 ENCOUNTER — Other Ambulatory Visit: Payer: Self-pay

## 2020-05-14 ENCOUNTER — Encounter (HOSPITAL_COMMUNITY): Payer: Self-pay | Admitting: Obstetrics and Gynecology

## 2020-05-14 DIAGNOSIS — O3481 Maternal care for other abnormalities of pelvic organs, first trimester: Secondary | ICD-10-CM | POA: Insufficient documentation

## 2020-05-14 DIAGNOSIS — O30009 Twin pregnancy, unspecified number of placenta and unspecified number of amniotic sacs, unspecified trimester: Secondary | ICD-10-CM

## 2020-05-14 DIAGNOSIS — J45909 Unspecified asthma, uncomplicated: Secondary | ICD-10-CM | POA: Insufficient documentation

## 2020-05-14 DIAGNOSIS — O26891 Other specified pregnancy related conditions, first trimester: Secondary | ICD-10-CM | POA: Insufficient documentation

## 2020-05-14 DIAGNOSIS — O219 Vomiting of pregnancy, unspecified: Secondary | ICD-10-CM

## 2020-05-14 DIAGNOSIS — E860 Dehydration: Secondary | ICD-10-CM | POA: Insufficient documentation

## 2020-05-14 DIAGNOSIS — R112 Nausea with vomiting, unspecified: Secondary | ICD-10-CM | POA: Insufficient documentation

## 2020-05-14 DIAGNOSIS — O30001 Twin pregnancy, unspecified number of placenta and unspecified number of amniotic sacs, first trimester: Secondary | ICD-10-CM | POA: Insufficient documentation

## 2020-05-14 DIAGNOSIS — R102 Pelvic and perineal pain: Secondary | ICD-10-CM | POA: Insufficient documentation

## 2020-05-14 DIAGNOSIS — R1030 Lower abdominal pain, unspecified: Secondary | ICD-10-CM | POA: Insufficient documentation

## 2020-05-14 DIAGNOSIS — N83201 Unspecified ovarian cyst, right side: Secondary | ICD-10-CM | POA: Insufficient documentation

## 2020-05-14 DIAGNOSIS — O99511 Diseases of the respiratory system complicating pregnancy, first trimester: Secondary | ICD-10-CM | POA: Insufficient documentation

## 2020-05-14 DIAGNOSIS — Z79899 Other long term (current) drug therapy: Secondary | ICD-10-CM | POA: Insufficient documentation

## 2020-05-14 DIAGNOSIS — Z3A01 Less than 8 weeks gestation of pregnancy: Secondary | ICD-10-CM | POA: Insufficient documentation

## 2020-05-14 LAB — URINALYSIS, ROUTINE W REFLEX MICROSCOPIC
Bilirubin Urine: NEGATIVE
Glucose, UA: NEGATIVE mg/dL
Ketones, ur: 5 mg/dL — AB
Leukocytes,Ua: NEGATIVE
Nitrite: NEGATIVE
Protein, ur: NEGATIVE mg/dL
Specific Gravity, Urine: 1.02 (ref 1.005–1.030)
pH: 5 (ref 5.0–8.0)

## 2020-05-14 MED ORDER — LACTATED RINGERS IV SOLN: Freq: Once | INTRAVENOUS | Status: AC

## 2020-05-14 MED ORDER — DIPHENHYDRAMINE HCL 50 MG/ML IJ SOLN
12.5000 mg | Freq: Once | INTRAMUSCULAR | Status: AC
  Administered 2020-05-15: 12.5 mg via INTRAVENOUS
  Filled 2020-05-14: qty 1

## 2020-05-14 MED ORDER — METOCLOPRAMIDE HCL 5 MG/ML IJ SOLN
5.0000 mg | Freq: Once | INTRAMUSCULAR | Status: AC
  Administered 2020-05-15: 5 mg via INTRAVENOUS
  Filled 2020-05-14: qty 2

## 2020-05-14 MED ORDER — FAMOTIDINE 20 MG PO TABS: 20.0000 mg | ORAL_TABLET | Freq: Once | ORAL | Status: DC

## 2020-05-14 NOTE — MAU Provider Note (Signed)
Chief Complaint: Emesis   First Provider Initiated Contact with Patient 05/14/20 2347        SUBJECTIVE HPI: Sarah [name] is a 24 y.o. G1P0 at [redacted]w[redacted]d by LMP who presents to maternity admissions reporting vomiting for 3 weeks.  Has gotten worse recently .  Unisom/B6 is no longer helping.  Also having some acid reflux. Has had some lower abdominal cramping.  Is scheduled for followup US next week  First US showed two sacs but no yolk sac or embryos.  Large Right ovarian cyst She denies vaginal bleeding, vaginal itching/burning, urinary symptoms, h/a, dizziness or fever/chills.    Emesis  This is a recurrent problem. The current episode started 1 to 4 weeks ago. The problem has been unchanged. The emesis has an appearance of bile. Pertinent negatives include no abdominal pain, chest pain, chills, diarrhea, fever or myalgias. Treatments tried: Unisom and B6. The treatment provided mild relief.   RN Note: Pt here with reports of vomiting for the past 3 weeks. Reports she cannot keep anything down. Is now vomiting up bile. Reports she has been taking unisom and B6, which are not helping. Does not have any other prescriptions for nausea/vomiting. Feels like she has acid reflux at times. Reports intermittent abdominal cramping.   Past Medical History:  Diagnosis Date  . Asthma    Past Surgical History:  Procedure Laterality Date  . TONSILLECTOMY     Social History   Socioeconomic History  . Marital status: Single    Spouse name: Not on file  . Number of children: Not on file  . Years of education: Not on file  . Highest education level: Not on file  Occupational History  . Not on file  Tobacco Use  . Smoking status: Never Smoker  . Smokeless tobacco: Never Used  Substance and Sexual Activity  . Alcohol use: No  . Drug use: No  . Sexual activity: Not on file  Other Topics Concern  . Not on file  Social History Narrative  . Not on file   Social Determinants of Health    Financial Resource Strain:   . Difficulty of Paying Living Expenses:   Food Insecurity: No Food Insecurity  . Worried About Programme researcher, broadcasting/film/video in the Last Year: Never true  . Ran Out of Food in the Last Year: Never true  Transportation Needs: No Transportation Needs  . Lack of Transportation (Medical): No  . Lack of Transportation (Non-Medical): No  Physical Activity:   . Days of Exercise per Week:   . Minutes of Exercise per Session:   Stress:   . Feeling of Stress :   Social Connections:   . Frequency of Communication with Friends and Family:   . Frequency of Social Gatherings with Friends and Family:   . Attends Religious Services:   . Active Member of Clubs or Organizations:   . Attends Banker Meetings:   Marland Kitchen Marital Status:   Intimate Partner Violence:   . Fear of Current or Ex-Partner:   . Emotionally Abused:   Marland Kitchen Physically Abused:   . Sexually Abused:    No current facility-administered medications on file prior to encounter.   Current Outpatient Medications on File Prior to Encounter  Medication Sig Dispense Refill  . albuterol (PROVENTIL HFA;VENTOLIN HFA) 108 (90 Base) MCG/ACT inhaler Inhale into the lungs every 6 (six) hours as needed for wheezing or shortness of breath.    . cetirizine (ZYRTEC) 10 MG tablet Take 10 mg  by mouth daily as needed for allergies.    Marland Kitchen doxylamine, Sleep, (UNISOM) 25 MG tablet Take 25 mg by mouth at bedtime as needed.    . fluticasone (VERAMYST) 27.5 MCG/SPRAY nasal spray Place 2 sprays into the nose daily.    Marland Kitchen loratadine (CLARITIN) 10 MG tablet Take 10 mg by mouth daily.     . montelukast (SINGULAIR) 10 MG tablet Take 10 mg by mouth at bedtime.    . pyridOXINE (VITAMIN B-6) 100 MG tablet Take 100 mg by mouth daily.    Marland Kitchen ibuprofen (ADVIL,MOTRIN) 800 MG tablet Take 1 tablet (800 mg total) by mouth 3 (three) times daily. (Patient not taking: Reported on 05/07/2020) 21 tablet 0  . metroNIDAZOLE (FLAGYL) 500 MG tablet Take 1  tablet (500 mg total) by mouth 2 (two) times daily. 14 tablet 0  . tiZANidine (ZANAFLEX) 4 MG tablet Take 1 tablet (4 mg total) by mouth every 6 (six) hours as needed for muscle spasms. (Patient not taking: Reported on 05/07/2020) 30 tablet 0   No Known Allergies  I have reviewed patient's Past Medical Hx, Surgical Hx, Family Hx, Social Hx, medications and allergies.   ROS:  Review of Systems  Constitutional: Negative for chills and fever.  Cardiovascular: Negative for chest pain.  Gastrointestinal: Positive for vomiting. Negative for abdominal pain and diarrhea.  Musculoskeletal: Negative for myalgias.   Review of Systems  Other systems negative   Physical Exam  Physical Exam Patient Vitals for the past 24 hrs:  BP Temp Temp src Pulse Resp SpO2 Height Weight  05/14/20 2313 (!) 114/63 99.2 F (37.3 C) Oral 84 18 100 % -- --  05/14/20 2307 -- -- -- -- -- -- 5\' 2"  (1.575 m) 64.7 kg   Constitutional: Well-developed, well-nourished female in no acute distress.  Cardiovascular: normal rate Respiratory: normal effort GI: Abd soft, non-tender. Pos BS x 4 MS: Extremities nontender, no edema, normal ROM Neurologic: Alert and oriented x 4.  GU: Neg CVAT.  PELVIC EXAM: Deferred  LAB RESULTS Results for orders placed or performed during the hospital encounter of 05/14/20 (from the past 24 hour(s))  Urinalysis, Routine w reflex microscopic     Status: Abnormal   Collection Time: 05/14/20 11:23 PM  Result Value Ref Range   Color, Urine YELLOW YELLOW   APPearance HAZY (A) CLEAR   Specific Gravity, Urine 1.020 1.005 - 1.030   pH 5.0 5.0 - 8.0   Glucose, UA NEGATIVE NEGATIVE mg/dL   Hgb urine dipstick SMALL (A) NEGATIVE   Bilirubin Urine NEGATIVE NEGATIVE   Ketones, ur 5 (A) NEGATIVE mg/dL   Protein, ur NEGATIVE NEGATIVE mg/dL   Nitrite NEGATIVE NEGATIVE   Leukocytes,Ua NEGATIVE NEGATIVE   RBC / HPF 0-5 0 - 5 RBC/hpf   WBC, UA 0-5 0 - 5 WBC/hpf   Bacteria, UA RARE (A) NONE SEEN    Squamous Epithelial / LPF 0-5 0 - 5   Mucus PRESENT    Hyaline Casts, UA PRESENT       IMAGING 05/16/20 OB Transvaginal  Result Date: 05/15/2020 CLINICAL DATA:  Pelvic pain EXAM: TWIN OBSTETRICAL ULTRASOUND <14 WKS COMPARISON:  05/07/2020 FINDINGS: Number of IUPs:  2 Chorionicity/Amnionicity:  Dichorionic-diamniotic (thick membrane) TWIN 1 Yolk sac:  Visualized. Embryo:  Visualized. Cardiac Activity: Visualized. Heart Rate: 120 bpm CRL:   5.8 mm   6 w 2 d                  05/09/2020 EDC: 01/06/2021 TWIN 2  Yolk sac:  Not Visualized. Embryo:  Not Visualized. MSD: 1.2 mm   6 w   0 d Subchorionic hemorrhage:  None visualized. Maternal uterus/adnexae: Again noted is a multilocular septated cyst within the right ovary measuring 6.1 x 7.3 x 7.2 cm. There is normal peripheral flow seen within the right ovary. The left ovary is normal in appearance. Trace free fluid is seen within the cul-de-sac. IMPRESSION: 1. Twin intrauterine gestational sacs. However there is only 1 single live fetal pole measuring 6 weeks 2 days. The second gestational sac does not contain a yolk sac or fetal pole. 2. Complex multilocular cyst within the right ovary, not significantly changed since the prior exam. Electronically Signed   By: Jonna Clark M.D.   On: 05/15/2020 01:25   MAU Management/MDM: Ordered followup Ultrasound to rule out ectopic. We now see a fetus with HR in one sac, other is empty Recommend keeping appt for followup  Treatments in MAU included IV hydration and . Reglan, Benadryl and Pepcid. She got good relief from this therapy.   This bleeding/pain can represent a normal pregnancy with bleeding, spontaneous abortion or even an ectopic which can be life-threatening.  The process as listed above helps to determine which of these is present.    ASSESSMENT 1. Pregnancy, twins   2. Pelvic pain affecting pregnancy   3.     Right large ovarian cyst 4.     Nausea and vomiting with mild dehydration  PLAN Discharge  home Has appt for  Ultrasound in about 7-10 days if HCG levels double appropriately  Rx sent for Phenergan for nausea and Protonix for acid refluix  Pt stable at time of discharge. Encouraged to return here or to other Urgent Care/ED if she develops worsening of symptoms, increase in pain, fever, or other concerning symptoms.    Wynelle Bourgeois CNM, MSN Certified Nurse-Midwife 05/14/2020  11:47 PM

## 2020-05-14 NOTE — MAU Note (Signed)
Pt here with reports of vomiting for the past 3 weeks. Reports she cannot keep anything down. Is now vomiting up bile. Reports she has been taking unisom and B6, which are not helping. Does not have any other prescriptions for nausea/vomiting. Feels like she has acid reflux at times. Reports intermittent abdominal cramping.

## 2020-05-15 ENCOUNTER — Inpatient Hospital Stay (HOSPITAL_COMMUNITY): Payer: 59

## 2020-05-15 ENCOUNTER — Encounter (HOSPITAL_COMMUNITY): Payer: Self-pay | Admitting: Obstetrics and Gynecology

## 2020-05-15 DIAGNOSIS — O30041 Twin pregnancy, dichorionic/diamniotic, first trimester: Secondary | ICD-10-CM

## 2020-05-15 DIAGNOSIS — Z79899 Other long term (current) drug therapy: Secondary | ICD-10-CM | POA: Diagnosis not present

## 2020-05-15 DIAGNOSIS — R1030 Lower abdominal pain, unspecified: Secondary | ICD-10-CM | POA: Diagnosis not present

## 2020-05-15 DIAGNOSIS — O30001 Twin pregnancy, unspecified number of placenta and unspecified number of amniotic sacs, first trimester: Secondary | ICD-10-CM | POA: Diagnosis not present

## 2020-05-15 DIAGNOSIS — Z3A01 Less than 8 weeks gestation of pregnancy: Secondary | ICD-10-CM

## 2020-05-15 DIAGNOSIS — O219 Vomiting of pregnancy, unspecified: Secondary | ICD-10-CM | POA: Diagnosis not present

## 2020-05-15 DIAGNOSIS — O99511 Diseases of the respiratory system complicating pregnancy, first trimester: Secondary | ICD-10-CM | POA: Diagnosis not present

## 2020-05-15 DIAGNOSIS — E860 Dehydration: Secondary | ICD-10-CM | POA: Diagnosis not present

## 2020-05-15 DIAGNOSIS — J45909 Unspecified asthma, uncomplicated: Secondary | ICD-10-CM | POA: Diagnosis not present

## 2020-05-15 DIAGNOSIS — O26891 Other specified pregnancy related conditions, first trimester: Secondary | ICD-10-CM

## 2020-05-15 DIAGNOSIS — O3481 Maternal care for other abnormalities of pelvic organs, first trimester: Secondary | ICD-10-CM | POA: Diagnosis not present

## 2020-05-15 DIAGNOSIS — R102 Pelvic and perineal pain: Secondary | ICD-10-CM | POA: Diagnosis not present

## 2020-05-15 DIAGNOSIS — N83201 Unspecified ovarian cyst, right side: Secondary | ICD-10-CM | POA: Diagnosis not present

## 2020-05-15 DIAGNOSIS — R112 Nausea with vomiting, unspecified: Secondary | ICD-10-CM | POA: Diagnosis not present

## 2020-05-15 MED ORDER — FAMOTIDINE IN NACL 20-0.9 MG/50ML-% IV SOLN
20.0000 mg | Freq: Once | INTRAVENOUS | Status: AC
  Administered 2020-05-15: 20 mg via INTRAVENOUS
  Filled 2020-05-15: qty 50

## 2020-05-15 MED ORDER — PANTOPRAZOLE SODIUM 20 MG PO TBEC
20.0000 mg | DELAYED_RELEASE_TABLET | Freq: Every day | ORAL | 1 refills | Status: DC
Start: 2020-05-15 — End: 2021-03-28

## 2020-05-15 MED ORDER — PROMETHAZINE HCL 25 MG PO TABS
25.0000 mg | ORAL_TABLET | Freq: Four times a day (QID) | ORAL | 2 refills | Status: DC | PRN
Start: 2020-05-15 — End: 2021-03-28

## 2020-05-15 NOTE — Discharge Instructions (Signed)
First Trimester of Pregnancy  The first trimester of pregnancy is from week 1 until the end of week 13 (months 1 through 3). During this time, your baby will begin to develop inside you. At 6-8 weeks, the eyes and face are formed, and the heartbeat can be seen on ultrasound. At the end of 12 weeks, all the baby's organs are formed. Prenatal care is all the medical care you receive before the birth of your baby. Make sure you get good prenatal care and follow all of your doctor's instructions. Follow these instructions at home: Medicines  Take over-the-counter and prescription medicines only as told by your doctor. Some medicines are safe and some medicines are not safe during pregnancy.  Take a prenatal vitamin that contains at least 600 micrograms (mcg) of folic acid.  If you have trouble pooping (constipation), take medicine that will make your stool soft (stool softener) if your doctor approves. Eating and drinking   Eat regular, healthy meals.  Your doctor will tell you the amount of weight gain that is right for you.  Avoid raw meat and uncooked cheese.  If you feel sick to your stomach (nauseous) or throw up (vomit): ? Eat 4 or 5 small meals a day instead of 3 large meals. ? Try eating a few soda crackers. ? Drink liquids between meals instead of during meals.  To prevent constipation: ? Eat foods that are high in fiber, like fresh fruits and vegetables, whole grains, and beans. ? Drink enough fluids to keep your pee (urine) clear or pale yellow. Activity  Exercise only as told by your doctor. Stop exercising if you have cramps or pain in your lower belly (abdomen) or low back.  Do not exercise if it is too hot, too humid, or if you are in a place of great height (high altitude).  Try to avoid standing for long periods of time. Move your legs often if you must stand in one place for a long time.  Avoid heavy lifting.  Wear low-heeled shoes. Sit and stand up  straight.  You can have sex unless your doctor tells you not to. Relieving pain and discomfort  Wear a good support bra if your breasts are sore.  Take warm water baths (sitz baths) to soothe pain or discomfort caused by hemorrhoids. Use hemorrhoid cream if your doctor says it is okay.  Rest with your legs raised if you have leg cramps or low back pain.  If you have puffy, bulging veins (varicose veins) in your legs: ? Wear support hose or compression stockings as told by your doctor. ? Raise (elevate) your feet for 15 minutes, 3-4 times a day. ? Limit salt in your food. Prenatal care  Schedule your prenatal visits by the twelfth week of pregnancy.  Write down your questions. Take them to your prenatal visits.  Keep all your prenatal visits as told by your doctor. This is important. Safety  Wear your seat belt at all times when driving.  Make a list of emergency phone numbers. The list should include numbers for family, friends, the hospital, and police and fire departments. General instructions  Ask your doctor for a referral to a local prenatal class. Begin classes no later than at the start of month 6 of your pregnancy.  Ask for help if you need counseling or if you need help with nutrition. Your doctor can give you advice or tell you where to go for help.  Do not use hot tubs, steam   rooms, or saunas.  Do not douche or use tampons or scented sanitary pads.  Do not cross your legs for long periods of time.  Avoid all herbs and alcohol. Avoid drugs that are not approved by your doctor.  Do not use any tobacco products, including cigarettes, chewing tobacco, and electronic cigarettes. If you need help quitting, ask your doctor. You may get counseling or other support to help you quit.  Avoid cat litter boxes and soil used by cats. These carry germs that can cause birth defects in the baby and can cause a loss of your baby (miscarriage) or stillbirth.  Visit your dentist.  At home, brush your teeth with a soft toothbrush. Be gentle when you floss. Contact a doctor if:  You are dizzy.  You have mild cramps or pressure in your lower belly.  You have a nagging pain in your belly area.  You continue to feel sick to your stomach, you throw up, or you have watery poop (diarrhea).  You have a bad smelling fluid coming from your vagina.  You have pain when you pee (urinate).  You have increased puffiness (swelling) in your face, hands, legs, or ankles. Get help right away if:  You have a fever.  You are leaking fluid from your vagina.  You have spotting or bleeding from your vagina.  You have very bad belly cramping or pain.  You gain or lose weight rapidly.  You throw up blood. It may look like coffee grounds.  You are around people who have [place] measles, fifth disease, or chickenpox.  You have a very bad headache.  You have shortness of breath.  You have any kind of trauma, such as from a fall or a car accident. Summary  The first trimester of pregnancy is from week 1 until the end of week 13 (months 1 through 3).  To take care of yourself and your unborn baby, you will need to eat healthy meals, take medicines only if your doctor tells you to do so, and do activities that are safe for you and your baby.  Keep all follow-up visits as told by your doctor. This is important as your doctor will have to ensure that your baby is healthy and growing well. This information is not intended to replace advice given to you by your health care provider. Make sure you discuss any questions you have with your health care provider. Document Revised: 01/25/2019 Document Reviewed: 10/12/2016 Elsevier Patient Education  2020 [employer] Morning Sickness  Morning sickness is when a woman feels nauseous during pregnancy. This nauseous feeling may or may not come with vomiting. It often occurs in the morning, but it can be a problem at any time of day.  Morning sickness is most common during the first trimester. In some cases, it may continue throughout pregnancy. Although morning sickness is unpleasant, it is usually harmless unless the woman develops severe and continual vomiting (hyperemesis gravidarum), a condition that requires more intense treatment. What are the causes? The exact cause of this condition is not known, but it seems to be related to normal hormonal changes that occur in pregnancy. What increases the risk? You are more likely to develop this condition if:  You experienced nausea or vomiting before your pregnancy.  You had morning sickness during a previous pregnancy.  You are pregnant with more than one baby, such as twins.  MARIJUANA may make vomiting WORSE What are the signs or symptoms? Symptoms of this condition include:  Nausea.  Vomiting. How is this diagnosed? This condition is usually diagnosed based on your signs and symptoms. How is this treated? In many cases, treatment is not needed for this condition. Making some changes to what you eat may help to control symptoms. Your health care provider may also prescribe or recommend:  Vitamin B6 supplements.  Anti-nausea medicines.  Ginger. Follow these instructions at home: Medicines  Take over-the-counter and prescription medicines only as told by your health care provider. Do not use any prescription, over-the-counter, or herbal medicines for morning sickness without first talking with your health care provider.  Taking multivitamins before getting pregnant can prevent or decrease the severity of morning sickness in most women. Eating and drinking  Eat a piece of dry toast or crackers before getting out of bed in the morning.  Eat 5 or 6 small meals a day.  Eat dry and bland foods, such as rice or a baked potato. Foods that are high in carbohydrates are often helpful.  Avoid greasy, fatty, and spicy foods.  Have someone cook for you if the smell  of any food causes nausea and vomiting.  If you feel nauseous after taking prenatal vitamins, take the vitamins at night or with a snack.  Snack on protein foods between meals if you are hungry. Nuts, yogurt, and cheese are good options.  Drink fluids throughout the day.  Try ginger ale made with real ginger, ginger tea made from fresh grated ginger, or ginger candies. General instructions  Do not use any products that contain nicotine or tobacco, such as cigarettes and e-cigarettes. If you need help quitting, ask your health care provider.  Get an air purifier to keep the air in your house free of odors.  Get plenty of fresh air.  Try to avoid odors that trigger your nausea.  Consider trying these methods to help relieve symptoms: ? Wearing an acupressure wristband. These wristbands are often worn for seasickness. ? Acupuncture. Contact a health care provider if:  Your home remedies are not working and you need medicine.  You feel dizzy or light-headed.  You are losing weight. Get help right away if:  You have persistent and uncontrolled nausea and vomiting.  You faint.  You have severe pain in your abdomen. Summary  Morning sickness is when a woman feels nauseous during pregnancy. This nauseous feeling may or may not come with vomiting.  Morning sickness is most common during the first trimester.  It often occurs in the morning, but it can be a problem at any time of day.  In many cases, treatment is not needed for this condition. Making some changes to what you eat may help to control symptoms. This information is not intended to replace advice given to you by your health care provider. Make sure you discuss any questions you have with your health care provider. Document Revised: 09/16/2017 Document Reviewed: 11/06/2016 Elsevier Patient Education  2020 [employer].

## 2020-05-18 ENCOUNTER — Other Ambulatory Visit: Payer: Self-pay

## 2020-05-18 ENCOUNTER — Inpatient Hospital Stay (HOSPITAL_COMMUNITY)
Admission: AD | Admit: 2020-05-18 | Discharge: 2020-05-19 | Disposition: A | Discharge status: Home / Self Care | Payer: 59 | Attending: Obstetrics & Gynecology | Admitting: Obstetrics & Gynecology

## 2020-05-18 DIAGNOSIS — Z3A01 Less than 8 weeks gestation of pregnancy: Secondary | ICD-10-CM | POA: Insufficient documentation

## 2020-05-18 DIAGNOSIS — O210 Mild hyperemesis gravidarum: Secondary | ICD-10-CM | POA: Insufficient documentation

## 2020-05-18 NOTE — MAU Note (Signed)
Pt here with reports of nausea and really bad acid reflux. Was seen recently for same complaints and it is not getting better. Pt reports she has vomited >10 times today. She reports she was sent in a RX for nausea medication, but did not get it filled because it was expensive. Was however able to get promethazine filled. Last took it 3 days ago. Pt reports "if she takes anything she is just going to throw it up."

## 2020-05-19 ENCOUNTER — Encounter (HOSPITAL_COMMUNITY): Payer: Self-pay | Admitting: Family Medicine

## 2020-05-19 ENCOUNTER — Inpatient Hospital Stay (EMERGENCY_DEPARTMENT_HOSPITAL)
Admission: AD | Admit: 2020-05-19 | Discharge: 2020-05-19 | Disposition: A | Discharge status: Home / Self Care | Payer: 59 | Source: Home / Self Care | Attending: Family Medicine | Admitting: Family Medicine

## 2020-05-19 ENCOUNTER — Ambulatory Visit: Admission: RE | Admit: 2020-05-19 | Payer: 59 | Source: Ambulatory Visit

## 2020-05-19 DIAGNOSIS — J45909 Unspecified asthma, uncomplicated: Secondary | ICD-10-CM | POA: Insufficient documentation

## 2020-05-19 DIAGNOSIS — Z3A01 Less than 8 weeks gestation of pregnancy: Secondary | ICD-10-CM | POA: Insufficient documentation

## 2020-05-19 DIAGNOSIS — Z79899 Other long term (current) drug therapy: Secondary | ICD-10-CM | POA: Insufficient documentation

## 2020-05-19 DIAGNOSIS — O210 Mild hyperemesis gravidarum: Secondary | ICD-10-CM | POA: Diagnosis present

## 2020-05-19 DIAGNOSIS — O99511 Diseases of the respiratory system complicating pregnancy, first trimester: Secondary | ICD-10-CM | POA: Insufficient documentation

## 2020-05-19 LAB — URINALYSIS, ROUTINE W REFLEX MICROSCOPIC
Bacteria, UA: NONE SEEN
Bilirubin Urine: NEGATIVE
Bilirubin Urine: NEGATIVE
Glucose, UA: NEGATIVE mg/dL
Glucose, UA: NEGATIVE mg/dL
Hgb urine dipstick: NEGATIVE
Ketones, ur: 80 mg/dL — AB
Ketones, ur: 80 mg/dL — AB
Leukocytes,Ua: NEGATIVE
Leukocytes,Ua: NEGATIVE
Nitrite: NEGATIVE
Nitrite: NEGATIVE
Protein, ur: 30 mg/dL — AB
Protein, ur: NEGATIVE mg/dL
Specific Gravity, Urine: 1.019 (ref 1.005–1.030)
Specific Gravity, Urine: 1.02 (ref 1.005–1.030)
pH: 5 (ref 5.0–8.0)
pH: 5 (ref 5.0–8.0)

## 2020-05-19 MED ORDER — PROMETHAZINE HCL 25 MG/ML IJ SOLN
25.0000 mg | Freq: Once | INTRAVENOUS | Status: AC
  Administered 2020-05-19: 25 mg via INTRAVENOUS
  Filled 2020-05-19: qty 1

## 2020-05-19 MED ORDER — M.V.I. ADULT IV INJ
Freq: Once | INTRAVENOUS | Status: AC
  Filled 2020-05-19: qty 1000

## 2020-05-19 MED ORDER — M.V.I. ADULT IV INJ
Freq: Once | INTRAVENOUS | Status: DC
  Filled 2020-05-19: qty 10

## 2020-05-19 MED ORDER — LACTATED RINGERS IV BOLUS: 1000.0000 mL | Freq: Once | INTRAVENOUS | Status: DC

## 2020-05-19 MED ORDER — FAMOTIDINE IN NACL 20-0.9 MG/50ML-% IV SOLN
20.0000 mg | Freq: Once | INTRAVENOUS | Status: AC
  Administered 2020-05-19: 20 mg via INTRAVENOUS
  Filled 2020-05-19: qty 50

## 2020-05-19 NOTE — MAU Note (Signed)
.   Sarah [name] is a 24 y.o. at [redacted]w[redacted]d here in MAU reporting: that she has been vomiting nonstop for weeks, reports lower abdominal cramping this morning. PT states " I was here yesterday to be evaluated and I left because it was taking too long just like today is"   Denies any VB LMP:04/01/20 Onset of complaint: ongoing Pain score: 7 Vitals:   05/19/20 0932  BP: 105/68  Pulse: 88  Resp: 16  Temp: 99 F (37.2 C)  SpO2: 99%     FHT: Lab orders placed from triage: [facility]

## 2020-05-19 NOTE — MAU Note (Signed)
Pt not in lobby x2 

## 2020-05-19 NOTE — MAU Note (Signed)
Pt not in lobby x3 

## 2020-05-19 NOTE — Discharge Instructions (Signed)

## 2020-05-19 NOTE — MAU Note (Signed)
Pt not in lobby x1

## 2020-05-19 NOTE — MAU Provider Note (Addendum)
History     CSN: [id number]  Arrival date and time: 05/19/20 3094   First Provider Initiated Contact with Patient 05/19/20 [phone]      Chief Complaint  Patient presents with  . Nausea  . Abdominal Pain   Ms. Sarah [name] is a 24 y.o. year old G1P0 female at [redacted]w[redacted]d weeks gestation who presents to MAU reporting vomiting nonstop for weeks.  She reports lower abdominal cramping that started this morning; rated 7 out of 10.  She was here yesterday on May 18, 2020 but left because "it was taking too long".  She denies vaginal bleeding.  Her last menstrual period was April 01, 2020.  She has a prescription for Phenergan that she tried to insert vaginally last night but not sure if it were because she went to sleep.  She reports every time she eats she vomits within "seconds".  She plans to start prenatal care at [facility]; first appointment June 05, 2020.   OB History    Gravida  1   Para      Term      Preterm      AB      Living        SAB      TAB      Ectopic      Multiple      Live Births              Past Medical History:  Diagnosis Date  . Asthma     Past Surgical History:  Procedure Laterality Date  . TONSILLECTOMY      Family History  Problem Relation Age of Onset  . Healthy Mother   . Healthy Father     Social History   Tobacco Use  . Smoking status: Never Smoker  . Smokeless tobacco: Never Used  Substance Use Topics  . Alcohol use: No  . Drug use: No    Allergies: No Known Allergies  Medications Prior to Admission  Medication Sig Dispense Refill Last Dose  . albuterol (PROVENTIL HFA;VENTOLIN HFA) 108 (90 Base) MCG/ACT inhaler Inhale into the lungs every 6 (six) hours as needed for wheezing or shortness of breath.     . cetirizine (ZYRTEC) 10 MG tablet Take 10 mg by mouth daily as needed for allergies.     Marland Kitchen doxylamine, Sleep, (UNISOM) 25 MG tablet Take 25 mg by mouth at bedtime as needed.     . fluticasone (VERAMYST)  27.5 MCG/SPRAY nasal spray Place 2 sprays into the nose daily.     Marland Kitchen loratadine (CLARITIN) 10 MG tablet Take 10 mg by mouth daily.      . metroNIDAZOLE (FLAGYL) 500 MG tablet Take 1 tablet (500 mg total) by mouth 2 (two) times daily. 14 tablet 0   . montelukast (SINGULAIR) 10 MG tablet Take 10 mg by mouth at bedtime.     . pantoprazole (PROTONIX) 20 MG tablet Take 1 tablet (20 mg total) by mouth daily. 30 tablet 1   . promethazine (PHENERGAN) 25 MG tablet Take 1 tablet (25 mg total) by mouth every 6 (six) hours as needed for nausea or vomiting. 30 tablet 2   . pyridOXINE (VITAMIN B-6) 100 MG tablet Take 100 mg by mouth daily.       Review of Systems  Constitutional: Positive for appetite change.  HENT: Negative.   Eyes: Negative.   Respiratory: Negative.   Cardiovascular: Negative.   Gastrointestinal: Positive for nausea and vomiting ("within seconds  of eating anything").  Endocrine: Negative.   Genitourinary: Positive for pelvic pain (lower abdominal cramping; 7/10 since this AM).  Musculoskeletal: Negative.   Skin: Negative.   Allergic/Immunologic: Negative.   Neurological: Positive for weakness.  Hematological: Negative.   Psychiatric/Behavioral: Negative.    Physical Exam   Blood pressure 105/68, pulse 88, temperature 99 F (37.2 C), resp. rate 16, weight 62.1 kg, SpO2 99 %.  Physical Exam Vitals and nursing note reviewed. Exam conducted with a chaperone present.  Constitutional:      Appearance: She is well-developed and normal weight.  HENT:     Head: Normocephalic and atraumatic.  Eyes:     Extraocular Movements: Extraocular movements intact.  Cardiovascular:     Rate and Rhythm: Normal rate.     Heart sounds: Normal heart sounds.  Pulmonary:     Effort: Pulmonary effort is normal.  Abdominal:     General: Abdomen is flat.     Palpations: Abdomen is soft.  Genitourinary:    Comments: deferred Skin:    General: Skin is warm and dry.  Neurological:     Mental  Status: She is alert and oriented to person, place, and time.  Psychiatric:        Attention and Perception: Attention and perception normal.        Mood and Affect: Affect is angry (stating she left last night, because of the long wait and now she has had to wait a long time again just get back to a room).        Speech: Speech normal.        Behavior: Behavior is agitated.        Thought Content: Thought content normal.        Cognition and Memory: Cognition and memory normal.        Judgment: Judgment normal.    Reassessment at 1100: Patient sleeping and not aroused upon CNM arrival to room  MAU Course  Procedures  MDM CCUA -- results from last night's visit IVFs: Phenergan 25 mg in LR 1000 ml @ 999 ml/hr; followed by MVI in LR 1000 ml @ 999 ml/hr -- no nausea/vomiting PO Challenge -- patient tolerated well   Results for orders placed or performed during the hospital encounter of 05/19/20 (from the past 24 hour(s))  Urinalysis, Routine w reflex microscopic     Status: Abnormal   Collection Time: 05/19/20 10:02 AM  Result Value Ref Range   Color, Urine YELLOW YELLOW   APPearance HAZY (A) CLEAR   Specific Gravity, Urine 1.019 1.005 - 1.030   pH 5.0 5.0 - 8.0   Glucose, UA NEGATIVE NEGATIVE mg/dL   Hgb urine dipstick SMALL (A) NEGATIVE   Bilirubin Urine NEGATIVE NEGATIVE   Ketones, ur 80 (A) NEGATIVE mg/dL   Protein, ur NEGATIVE NEGATIVE mg/dL   Nitrite NEGATIVE NEGATIVE   Leukocytes,Ua NEGATIVE NEGATIVE   RBC / HPF 0-5 0 - 5 RBC/hpf   WBC, UA 0-5 0 - 5 WBC/hpf   Bacteria, UA RARE (A) NONE SEEN   Squamous Epithelial / LPF 0-5 0 - 5   Mucus PRESENT     Assessment and Plan  Morning sickness - Plan: Discharge patient - Advised to take anti-emetic medications as previously prescribed -- ok to use Phenergan vaginally - Information provided on morning sickness - Keep scheduled appt with [facility] on 06/05/20 - Patient verbalized an understanding of the plan of care and agrees.      Raelyn Mora, MSN, CNM 05/19/2020,  9:48 AM

## 2020-05-24 ENCOUNTER — Encounter (HOSPITAL_COMMUNITY): Payer: Self-pay | Admitting: Obstetrics & Gynecology

## 2020-05-24 ENCOUNTER — Other Ambulatory Visit: Payer: Self-pay

## 2020-05-24 ENCOUNTER — Inpatient Hospital Stay (HOSPITAL_COMMUNITY)
Admission: AD | Admit: 2020-05-24 | Discharge: 2020-05-25 | Disposition: A | Discharge status: Home / Self Care | Payer: 59 | Attending: Obstetrics & Gynecology | Admitting: Obstetrics & Gynecology

## 2020-05-24 DIAGNOSIS — Z79899 Other long term (current) drug therapy: Secondary | ICD-10-CM | POA: Diagnosis not present

## 2020-05-24 DIAGNOSIS — O039 Complete or unspecified spontaneous abortion without complication: Secondary | ICD-10-CM | POA: Insufficient documentation

## 2020-05-24 DIAGNOSIS — J45909 Unspecified asthma, uncomplicated: Secondary | ICD-10-CM | POA: Insufficient documentation

## 2020-05-24 DIAGNOSIS — O209 Hemorrhage in early pregnancy, unspecified: Secondary | ICD-10-CM

## 2020-05-24 DIAGNOSIS — O99511 Diseases of the respiratory system complicating pregnancy, first trimester: Secondary | ICD-10-CM | POA: Insufficient documentation

## 2020-05-24 DIAGNOSIS — Z3A08 8 weeks gestation of pregnancy: Secondary | ICD-10-CM | POA: Diagnosis not present

## 2020-05-24 NOTE — MAU Provider Note (Signed)
History     CSN: [id number]  Arrival date and time: 05/24/20 2204   First Provider Initiated Contact with Patient 05/24/20 2257      Chief Complaint  Patient presents with  . Vaginal Bleeding   Sarah [name] is a 24 y.o. G1P0 at [redacted]w[redacted]d who receives care at [facility].  She presents today for Vaginal Bleeding.  She states she started bleeding at 2 or 3 o'clock this afternoon.  She states initially she had a gush and bleed through her pants and when she changed she bleed through the pad. She reports she has used two pads and bleed through both of them.  She endorses passing of clots and states one was the size of a plum.  She reports that she was cramping, but it has since subsided.  She denies abnormal vaginal discharge prior to the bleeding. Patient endorses sexual activity in the past 3 days, but denies pain or discomfort.  She further denies pain or discomfort with urination.    OB History    Gravida  1   Para      Term      Preterm      AB      Living        SAB      TAB      Ectopic      Multiple      Live Births              Past Medical History:  Diagnosis Date  . Asthma     Past Surgical History:  Procedure Laterality Date  . TONSILLECTOMY      Family History  Problem Relation Age of Onset  . Healthy Mother   . Healthy Father     Social History   Tobacco Use  . Smoking status: Never Smoker  . Smokeless tobacco: Never Used  Substance Use Topics  . Alcohol use: No  . Drug use: No    Allergies: No Known Allergies  Medications Prior to Admission  Medication Sig Dispense Refill Last Dose  . fluticasone (VERAMYST) 27.5 MCG/SPRAY nasal spray Place 2 sprays into the nose daily.   Past Month at Unknown time  . promethazine (PHENERGAN) 25 MG tablet Take 1 tablet (25 mg total) by mouth every 6 (six) hours as needed for nausea or vomiting. 30 tablet 2 05/24/2020 at Unknown time  . pyridOXINE (VITAMIN B-6) 100 MG tablet Take 100 mg by  mouth daily.   Past Week at Unknown time  . albuterol (PROVENTIL HFA;VENTOLIN HFA) 108 (90 Base) MCG/ACT inhaler Inhale into the lungs every 6 (six) hours as needed for wheezing or shortness of breath.     . cetirizine (ZYRTEC) 10 MG tablet Take 10 mg by mouth daily as needed for allergies.   More than a month at Unknown time  . doxylamine, Sleep, (UNISOM) 25 MG tablet Take 25 mg by mouth at bedtime as needed.   More than a month at Unknown time  . loratadine (CLARITIN) 10 MG tablet Take 10 mg by mouth daily.    More than a month at Unknown time  . montelukast (SINGULAIR) 10 MG tablet Take 10 mg by mouth at bedtime.   More than a month at Unknown time  . pantoprazole (PROTONIX) 20 MG tablet Take 1 tablet (20 mg total) by mouth daily. 30 tablet 1     Review of Systems  Constitutional: Negative for chills and fever.  Respiratory: Negative for cough and  shortness of breath.   Gastrointestinal: Positive for abdominal pain (8/10 Sharp-None currently. ), nausea and vomiting.  Genitourinary: Positive for vaginal bleeding. Negative for difficulty urinating, dyspareunia, dysuria, pelvic pain and vaginal discharge.  Neurological: Negative for dizziness, light-headedness and headaches.   Physical Exam   Blood pressure 122/68, pulse (!) 101, temperature 98.7 F (37.1 C), resp. rate 19.  Physical Exam Exam conducted with a chaperone present.  Constitutional:      Appearance: Normal appearance.  HENT:     Head: Normocephalic and atraumatic.  Eyes:     Conjunctiva/sclera: Conjunctivae normal.  Cardiovascular:     Rate and Rhythm: Regular rhythm. Tachycardia present.  Pulmonary:     Effort: Pulmonary effort is normal. No respiratory distress.  Abdominal:     General: Abdomen is flat. There is no distension.     Tenderness: There is no abdominal tenderness.  Genitourinary:    Labia:        Right: No tenderness or lesion.        Left: No tenderness or lesion.      Vagina: Bleeding present.      Cervix: Cervical bleeding present. No cervical motion tenderness or friability.     Uterus: Enlarged.      Comments: Speculum Exam: -Normal External Genitalia: Non tender, no apparent discharge at introitus.  -Vaginal Vault: Pink mucosa with good rugae. Small amt blood in vault. - -Cervix:Pink, no lesions, cysts, or polyps.  Appears open with some POC from os.  Removed with ring forceps. No active bleeding from os after removal- -Bimanual Exam:  Uterine size at ~6 weeks.  Nontender.  Os closed.   Musculoskeletal:        General: Normal range of motion.     Cervical back: Normal range of motion.  Skin:    General: Skin is warm and dry.  Neurological:     Mental Status: She is alert and oriented to person, place, and time.  Psychiatric:        Mood and Affect: Mood normal.        Behavior: Behavior normal.        Thought Content: Thought content normal.        Judgment: Judgment normal.     MAU Course  Procedures Results for orders placed or performed during the hospital encounter of 05/24/20 (from the past 24 hour(s))  CBC     Status: None   Collection Time: 05/24/20 11:50 PM  Result Value Ref Range   WBC 6.8 4.0 - 10.5 K/uL   RBC 4.27 3.87 - 5.11 MIL/uL   Hemoglobin 12.2 12.0 - 15.0 g/dL   HCT 52.7 36 - 46 %   MCV 86.4 80.0 - 100.0 fL   MCH 28.6 26.0 - 34.0 pg   MCHC 33.1 30.0 - 36.0 g/dL   RDW 78.2 42.3 - 53.6 %   Platelets 339 150 - 400 K/uL   nRBC 0.0 0.0 - 0.2 %  hCG, quantitative, pregnancy     Status: Abnormal   Collection Time: 05/24/20 11:50 PM  Result Value Ref Range   hCG, Beta Chain, Quant, S 42,344 (H) <5 mIU/mL  ABO/Rh     Status: None   Collection Time: 05/24/20 11:50 PM  Result Value Ref Range   ABO/RH(D) B POS    No rh immune globuloin      NOT A RH IMMUNE GLOBULIN CANDIDATE, PT RH POSITIVE Performed at [facility], [address], [place], [place] [address]  US OB Transvaginal  Result Date: 05/25/2020 CLINICAL DATA:  Heavy vaginal bleeding  and pelvic pain. Quantitative beta HCG is pending. Assigned gestational age is 7 weeks 5 days. EXAM: TRANSVAGINAL OB ULTRASOUND TECHNIQUE: Transvaginal ultrasound was performed for complete evaluation of the gestation as well as the maternal uterus, adnexal regions, and pelvic cul-de-sac. COMPARISON:  05/15/2020 FINDINGS: Intrauterine gestational sac: No intrauterine gestational sac is identified. Yolk sac:  Yolk sac is not identified. Embryo:  Fetal pole is not identified. Cardiac Activity: No fetal cardiac activity is observed. Maternal uterus/adnexae: The uterus is anteverted. Diffusely thickened and heterogeneous increased echotexture of the endometrium. Endometrial stripe thickness measures about 2.5 cm. No myometrial masses. The right ovary measures 7.6 x 7.4 by 8 cm. There is a large cyst measuring about 8 cm maximal diameter. Small septations are present. Mild internal echoes. Possibly hemorrhagic cyst. No change in appearance since previous study. The left ovary measures 3.3 x 1.7 x 1.4 cm.  Normal appearance. Small amount of free fluid in the pelvis. IMPRESSION: No intrauterine gestational sac is identified today. Thickened and heterogeneous appearance of the endometrium. Changes are consistent with interval loss of pregnancy since the previous study. Stable appearance of right ovarian cyst, possibly hemorrhagic cyst. Electronically Signed   By: Burman Nieves M.D.   On: 05/25/2020 00:45     MDM  Pelvic Exam Labs: UA,CBC, hCG, ABO Ultrasound Assessment and Plan  24 year old, G1P0  SIUP at 8.1weeks Vaginal Bleeding Blood Type Unknown  -Reviewed POC with patient. -Exam performed and findings discussed.  -Vaginal contents to be sent to pathology. -Labs ordered. -Patient offered and declines pain medication. -Will send for [place] and await results.    Cherre Robins 05/24/2020, 10:57 PM   Reassessment (1:28 AM) SAB B Positive  -[place] and labs reviewed. -Provider to bedside to discuss  results. -Condolences given. -Patient questions regarding causes of SAB addressed. -Discussed follow up with primary ob for serial hCG. -Patient questions when to resume sexual activity and informed that she should wait 2-6 weeks. -Patient verbalized understanding and without questions or concerns. -Encouraged to call or return to MAU if symptoms worsen or with the onset of new symptoms. -Discharged to home in stable condition.  Cherre Robins MSN, CNM Advanced Practice Provider, [facility]

## 2020-05-24 NOTE — MAU Note (Signed)
Pt reports to MAU stating around 1500 she noticed she started to bleed. Pt reports when she stood up it felt as if she was peeing on herself but it was blood running down her leg. Pt states she bled through her pants proceeded to put 2 pads on and was bleeding through that as well. Pt reports she has not looked to see if there is any clots. Pt reports the blood is running down her legs. Pt reports no pain now but prior to the bleeding she had abdominal pain.

## 2020-05-25 ENCOUNTER — Inpatient Hospital Stay (HOSPITAL_COMMUNITY): Payer: 59

## 2020-05-25 DIAGNOSIS — O039 Complete or unspecified spontaneous abortion without complication: Secondary | ICD-10-CM

## 2020-05-25 DIAGNOSIS — Z3A08 8 weeks gestation of pregnancy: Secondary | ICD-10-CM

## 2020-05-25 LAB — CBC
HCT: 36.9 % (ref 36.0–46.0)
Hemoglobin: 12.2 g/dL (ref 12.0–15.0)
MCH: 28.6 pg (ref 26.0–34.0)
MCHC: 33.1 g/dL (ref 30.0–36.0)
MCV: 86.4 fL (ref 80.0–100.0)
Platelets: 339 10*3/uL (ref 150–400)
RBC: 4.27 MIL/uL (ref 3.87–5.11)
RDW: 12.3 % (ref 11.5–15.5)
WBC: 6.8 10*3/uL (ref 4.0–10.5)
nRBC: 0 % (ref 0.0–0.2)

## 2020-05-25 LAB — HCG, QUANTITATIVE, PREGNANCY: hCG, Beta Chain, Quant, S: 42344 m[IU]/mL — ABNORMAL HIGH (ref <5)

## 2020-05-25 LAB — ABO/RH: ABO/RH(D): B POS

## 2020-05-25 NOTE — Discharge Instructions (Signed)
Managing Pregnancy Loss Pregnancy loss can happen any time during a pregnancy. Often the cause is not known. It is rarely because of anything you did. Pregnancy loss in early pregnancy (during the first trimester) is called a miscarriage. This type of pregnancy loss is the most common. Pregnancy loss that happens after 20 weeks of pregnancy is called fetal demise if the baby's heart stops beating before birth. Fetal demise is much less common. Some women experience spontaneous labor shortly after fetal demise resulting in a stillborn birth (stillbirth). Any pregnancy loss can be devastating. You will need to recover both physically and emotionally. Most women are able to get pregnant again after a pregnancy loss and deliver a healthy baby. How to manage emotional recovery  Pregnancy loss is very hard emotionally. You may feel many different emotions while you grieve. You may feel sad and angry. You may also feel guilty. It is normal to have periods of crying. Emotional recovery can take longer than physical recovery. It is different for everyone. Taking these steps can help you in managing this loss:  Remember that it is unlikely you did anything to cause the pregnancy loss.  Share your thoughts and feelings with friends, family, and your partner. Remember that your partner is also recovering emotionally.  Make sure you have a good support system. Do not spend too much time alone.  Meet with a pregnancy loss counselor or join a pregnancy loss support group.  Get enough sleep and eat a healthy diet. Return to regular exercise when you have recovered physically.  Do not use drugs or alcohol to manage your emotions.  Consider seeing a mental health professional to help you recover emotionally.  Ask a friend or loved one to help you decide what to do with any clothing and nursery items you received for your baby. In the case of a stillbirth, many women benefit from taking additional steps in the  grieving process. You may want to:  Hold your baby after the birth.  Name your baby.  Request a birth certificate.  Create a keepsake such as handprints or footprints.  Dress your baby and have a picture taken.  Make funeral arrangements.  Ask for a baptism or blessing. Hospitals have staff members who can help you with all these arrangements. How to recognize emotional stress It is normal to have emotional stress after a pregnancy loss. But emotional stress that lasts a long time or becomes severe requires treatment. Watch out for these signs of severe emotional stress:  Sadness, anger, or guilt that is not going away and is interfering with your normal activities.  Relationship problems that have occurred or gotten worse since the pregnancy loss.  Signs of depression that last longer than 2 weeks. These may include: ? Sadness. ? Anxiety. ? Hopelessness. ? Loss of interest in activities you enjoy. ? Inability to concentrate. ? Trouble sleeping or sleeping too much. ? Loss of appetite or overeating. ? Thoughts of death or of hurting yourself. Follow these instructions at home:  Take over-the-counter and prescription medicines only as told by your health care provider.  Rest at home until your energy level returns. Return to your normal activities as told by your health care provider. Ask your health care provider what activities are safe for you.  When you are ready, meet with your health care provider to discuss steps to take for a future pregnancy.  Keep all follow-up visits as told by your health care provider. This is important.  Where to find support  To help you and your partner with the process of grieving, talk with your health care provider or seek counseling.  Consider meeting with others who have experienced pregnancy loss. Ask your health care provider about support groups and resources. Where to find more information  U.S. Department of Health and Human  Services Office on Women's Health: www.womenshealth.gov  American Pregnancy Association: www.americanpregnancy.org Contact a health care provider if:  You continue to experience grief, sadness, or lack of motivation for everyday activities, and those feelings do not improve over time.  You are struggling to recover emotionally, especially if you are using alcohol or substances to help. Get help right away if:  You have thoughts of hurting yourself or others. If you ever feel like you may hurt yourself or others, or have thoughts about taking your own life, get help right away. You can go to your nearest emergency department or call:  Your local emergency services (911 in the [place]  A suicide crisis helpline, such as the National Suicide Prevention Lifeline at 1-800-273-8255. This is open 24 hours a day. Summary  Any pregnancy loss can be difficult physically and emotionally.  You may experience many different emotions while you grieve. Emotional recovery can last longer than physical recovery.  It is normal to have emotional stress after a pregnancy loss. But emotional stress that lasts a long time or becomes severe requires treatment.  See your health care provider if you are struggling emotionally after a pregnancy loss. This information is not intended to replace advice given to you by your health care provider. Make sure you discuss any questions you have with your health care provider. Document Revised: 01/24/2019 Document Reviewed: 12/15/2017 Elsevier Patient Education  2020 Elsevier Inc.      Miscarriage A miscarriage is the loss of an unborn baby (fetus) before the 20th week of pregnancy. Most miscarriages happen during the first 3 months of pregnancy. Sometimes, a miscarriage can happen before a woman knows that she is pregnant. Having a miscarriage can be an emotional experience. If you have had a miscarriage, talk with your health care provider about any questions you  may have about miscarrying, the grieving process, and your plans for future pregnancy. What are the causes? A miscarriage may be caused by:  Problems with the genes or chromosomes of the fetus. These problems make it impossible for the baby to develop normally. They are often the result of random errors that occur early in the development of the baby, and are not passed from parent to child (not inherited).  Infection of the cervix or uterus.  Conditions that affect hormone balance in the body.  Problems with the cervix, such as the cervix opening and thinning before pregnancy is at term (cervical insufficiency).  Problems with the uterus. These may include: ? A uterus with an abnormal shape. ? Fibroids in the uterus. ? Congenital abnormalities. These are problems that were present at birth.  Certain medical conditions.  Smoking, drinking alcohol, or using drugs.  Injury (trauma). In many cases, the cause of a miscarriage is not known. What are the signs or symptoms? Symptoms of this condition include:  Vaginal bleeding or spotting, with or without cramps or pain.  Pain or cramping in the abdomen or lower back.  Passing fluid, tissue, or blood clots from the vagina. How is this diagnosed? This condition may be diagnosed based on:  A physical exam.  Ultrasound.  Blood tests.  Urine tests. How   is this treated? Treatment for a miscarriage is sometimes not necessary if you naturally pass all the tissue that was in your uterus. If necessary, this condition may be treated with:  Dilation and curettage (D&C). This is a procedure in which the cervix is stretched open and the lining of the uterus (endometrium) is scraped. This is done only if tissue from the fetus or placenta remains in the body (incomplete miscarriage).  Medicines, such as: ? Antibiotic medicine, to treat infection. ? Medicine to help the body pass any remaining tissue. ? Medicine to reduce (contract) the  size of the uterus. These medicines may be given if you have a lot of bleeding. If you have Rh negative blood and your baby was Rh positive, you will need a shot of a medicine called Rh immunoglobulinto protect your future babies from Rh blood problems. "Rh-negative" and "Rh-positive" refer to whether or not the blood has a specific protein found on the surface of red blood cells (Rh factor). Follow these instructions at home: Medicines   Take over-the-counter and prescription medicines only as told by your health care provider.  If you were prescribed antibiotic medicine, take it as told by your health care provider. Do not stop taking the antibiotic even if you start to feel better.  Do not take NSAIDs, such as aspirin and ibuprofen, unless they are approved by your health care provider. These medicines can cause bleeding. Activity  Rest as directed. Ask your health care provider what activities are safe for you.  Have someone help with home and family responsibilities during this time. General instructions  Keep track of the number of sanitary pads you use each day and how soaked (saturated) they are. Write down this information.  Monitor the amount of tissue or blood clots that you pass from your vagina. Save any large amounts of tissue for your health care provider to examine.  Do not use tampons, douche, or have sex until your health care provider approves.  To help you and your partner with the process of grieving, talk with your health care provider or seek counseling.  When you are ready, meet with your health care provider to discuss any important steps you should take for your health. Also, discuss steps you should take to have a healthy pregnancy in the future.  Keep all follow-up visits as told by your health care provider. This is important. Where to find more information  The American Congress of Obstetricians and Gynecologists: www.acog.org  U.S. Department of Health  and Human Services Office of Women's Health: www.womenshealth.gov Contact a health care provider if:  You have a fever or chills.  You have a foul smelling vaginal discharge.  You have more bleeding instead of less. Get help right away if:  You have severe cramps or pain in your back or abdomen.  You pass blood clots or tissue from your vagina that is walnut-sized or larger.  You soak more than 1 regular sanitary pad in an hour.  You become light-headed or weak.  You pass out.  You have feelings of sadness that take over your thoughts, or you have thoughts of hurting yourself. Summary  Most miscarriages happen in the first 3 months of pregnancy. Sometimes miscarriage happens before a woman even knows that she is pregnant.  Follow your health care provider's instruction for home care. Keep all follow-up appointments.  To help you and your partner with the process of grieving, talk with your health care provider or seek   counseling. This information is not intended to replace advice given to you by your health care provider. Make sure you discuss any questions you have with your health care provider. Document Revised: 01/26/2019 Document Reviewed: 11/09/2016 Elsevier Patient Education  2020 Elsevier Inc.  

## 2020-05-27 LAB — SURGICAL PATHOLOGY

## 2020-10-18 NOTE — L&D Delivery Note (Signed)
Delivery Note Sarah [name] is a G2P0010 at [redacted]w[redacted]d who had a spontaneous delivery at 13:45 05/25/21  a viable female "Sarah [name]" was delivered via  LOA.  APGAR: 9, 9; weight 6 lb 4.7 oz (2855 g)  Sarah [name] was admitted for labor. Progressed normally. Received an epidural for pain management. Pushed for 35 minutes. Baby was delivered without difficulty. No nuchal cord. Baby placed on maternal abdomen. Delayed cord clamping for 60 seconds. Delivery of placenta was spontaneous. Placenta was found to be intact, 3 -vessel cord was noted. The fundus was found to be firm and the lower uterine segmant was cleared of clot x2. No lacerations. Estimated blood loss 50cc. Instrument and gauze counts were correct at the end of the procedure.   Placenta status: to L&D  Mom to postpartum.  Baby to Couplet care / Skin to Skin.  Sarah [name] 05/25/2021, 2:00 PM

## 2020-10-27 LAB — OB RESULTS CONSOLE GC/CHLAMYDIA
Chlamydia: NEGATIVE
Gonorrhea: NEGATIVE

## 2020-11-05 ENCOUNTER — Other Ambulatory Visit: Payer: Self-pay | Admitting: Obstetrics and Gynecology

## 2020-11-05 DIAGNOSIS — O26841 Uterine size-date discrepancy, first trimester: Secondary | ICD-10-CM

## 2020-11-17 ENCOUNTER — Ambulatory Visit
Admission: RE | Admit: 2020-11-17 | Discharge: 2020-11-17 | Disposition: A | Discharge status: Home / Self Care | Payer: Managed Care, Other (non HMO) | Source: Ambulatory Visit | Attending: Obstetrics and Gynecology | Admitting: Obstetrics and Gynecology

## 2020-11-17 ENCOUNTER — Other Ambulatory Visit: Payer: Self-pay | Admitting: Obstetrics and Gynecology

## 2020-11-17 ENCOUNTER — Other Ambulatory Visit: Payer: Self-pay

## 2020-11-17 DIAGNOSIS — O26841 Uterine size-date discrepancy, first trimester: Secondary | ICD-10-CM | POA: Diagnosis present

## 2020-11-20 LAB — OB RESULTS CONSOLE RPR: RPR: NONREACTIVE

## 2020-11-20 LAB — OB RESULTS CONSOLE RUBELLA ANTIBODY, IGM: Rubella: IMMUNE

## 2020-11-20 LAB — HEPATITIS C ANTIBODY: HCV Ab: NEGATIVE

## 2020-11-20 LAB — OB RESULTS CONSOLE HEPATITIS B SURFACE ANTIGEN: Hepatitis B Surface Ag: NEGATIVE

## 2020-11-20 LAB — OB RESULTS CONSOLE HIV ANTIBODY (ROUTINE TESTING): HIV: NONREACTIVE

## 2020-11-24 ENCOUNTER — Other Ambulatory Visit: Payer: Self-pay | Admitting: Obstetrics and Gynecology

## 2020-11-24 DIAGNOSIS — Z363 Encounter for antenatal screening for malformations: Secondary | ICD-10-CM

## 2021-01-05 ENCOUNTER — Ambulatory Visit: Payer: Managed Care, Other (non HMO) | Attending: Obstetrics and Gynecology

## 2021-01-05 ENCOUNTER — Other Ambulatory Visit: Payer: Self-pay

## 2021-01-05 DIAGNOSIS — Z363 Encounter for antenatal screening for malformations: Secondary | ICD-10-CM | POA: Insufficient documentation

## 2021-01-06 ENCOUNTER — Other Ambulatory Visit: Payer: Self-pay | Admitting: *Deleted

## 2021-01-06 DIAGNOSIS — Z362 Encounter for other antenatal screening follow-up: Secondary | ICD-10-CM

## 2021-02-03 ENCOUNTER — Encounter: Payer: Self-pay | Admitting: *Deleted

## 2021-02-04 ENCOUNTER — Ambulatory Visit: Payer: Managed Care, Other (non HMO) | Attending: Obstetrics

## 2021-02-04 ENCOUNTER — Ambulatory Visit: Payer: Managed Care, Other (non HMO) | Admitting: *Deleted

## 2021-02-04 ENCOUNTER — Other Ambulatory Visit: Payer: Self-pay

## 2021-02-04 VITALS — BP 111/62 | HR 94

## 2021-02-04 DIAGNOSIS — Z362 Encounter for other antenatal screening follow-up: Secondary | ICD-10-CM

## 2021-02-04 DIAGNOSIS — Z3A23 23 weeks gestation of pregnancy: Secondary | ICD-10-CM | POA: Diagnosis not present

## 2021-02-05 ENCOUNTER — Other Ambulatory Visit: Payer: Self-pay | Admitting: *Deleted

## 2021-02-05 DIAGNOSIS — O4440 Low lying placenta NOS or without hemorrhage, unspecified trimester: Secondary | ICD-10-CM

## 2021-02-24 LAB — OB RESULTS CONSOLE RPR: RPR: NONREACTIVE

## 2021-03-14 IMAGING — US US OB TRANSVAGINAL
1 series · 15 of 28 positions shown · non-contrast
Comparison: 05/07/2020

CLINICAL DATA: Pelvic pain

EXAM:
TWIN OBSTETRICAL ULTRASOUND <14 WKS

[Series 1: us ob transvaginal · 33 acquisitions, 15 frames shown]
[im 1/33]
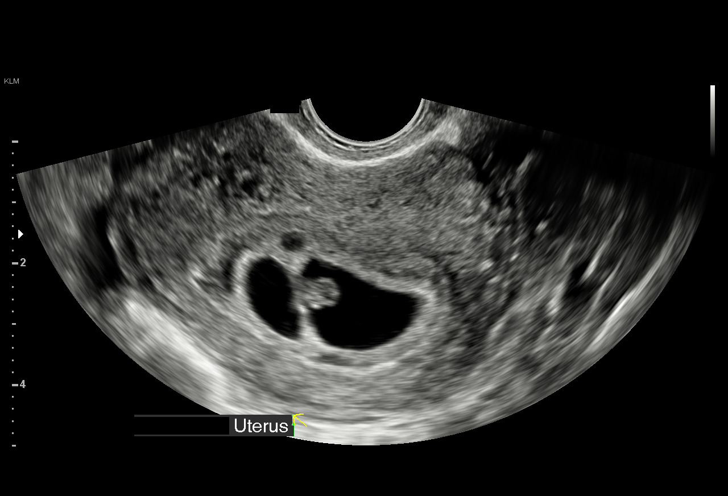
[im 3/33]
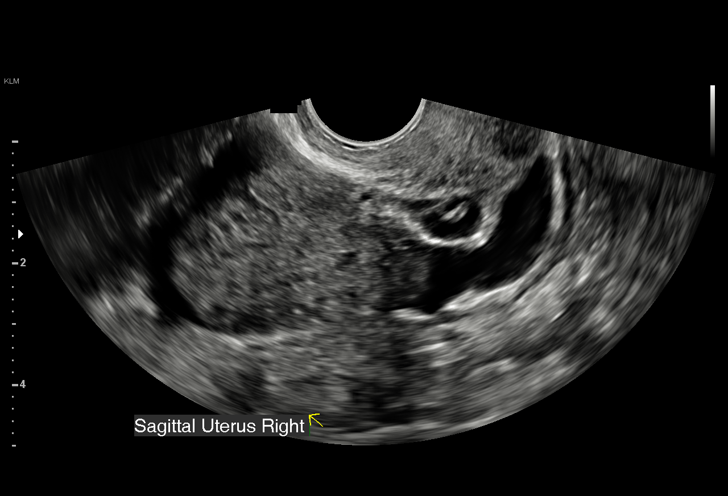
[im 5/33]
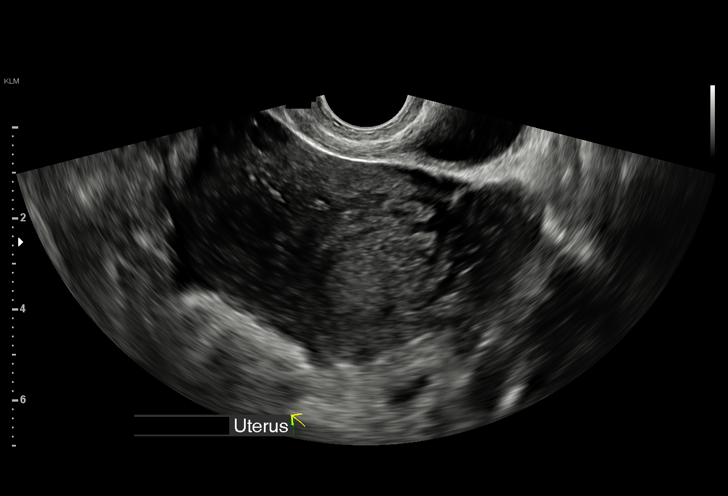
[im 8/33]
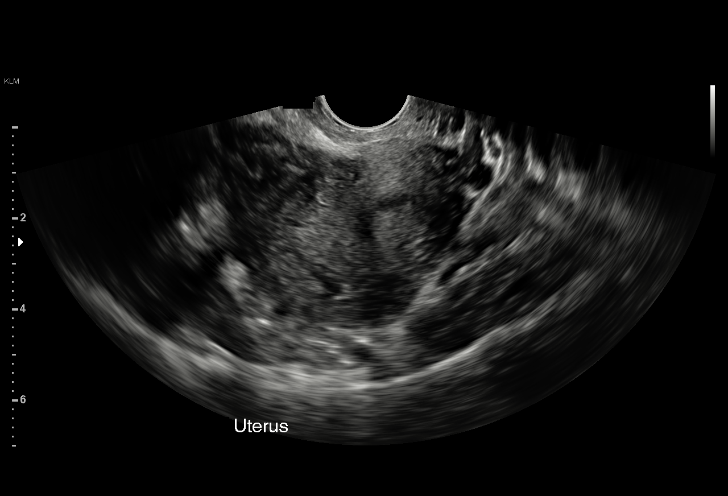
[im 10/33]
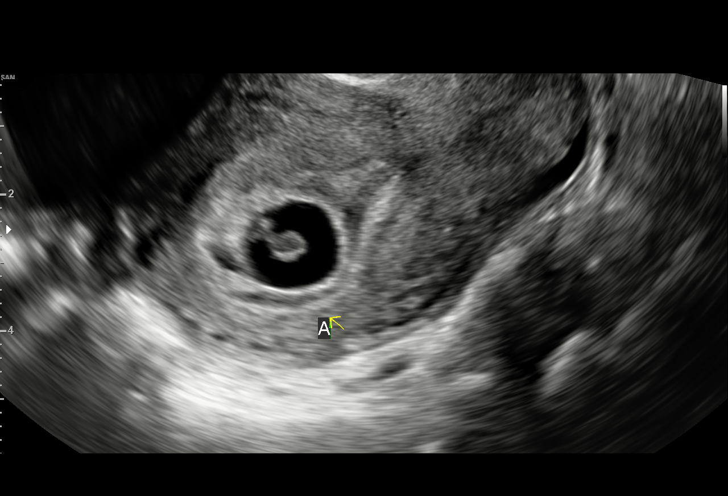
[im 12/33]
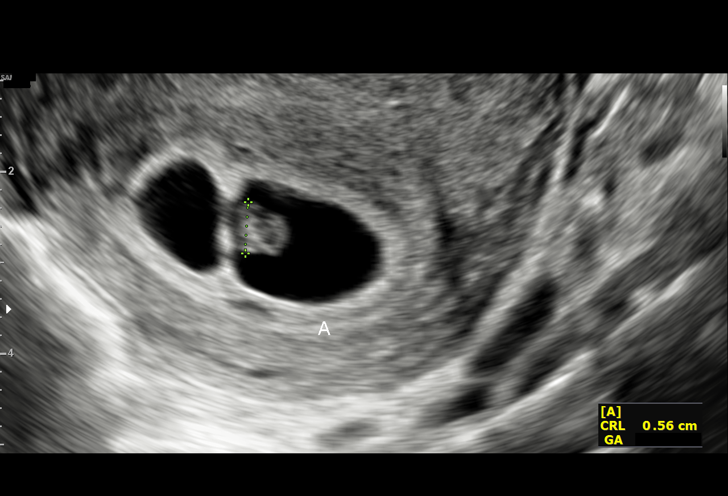
[im 15/33]
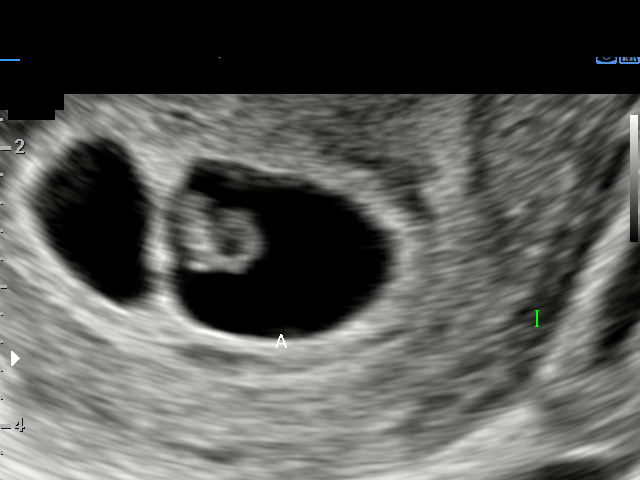
[im 17/33]
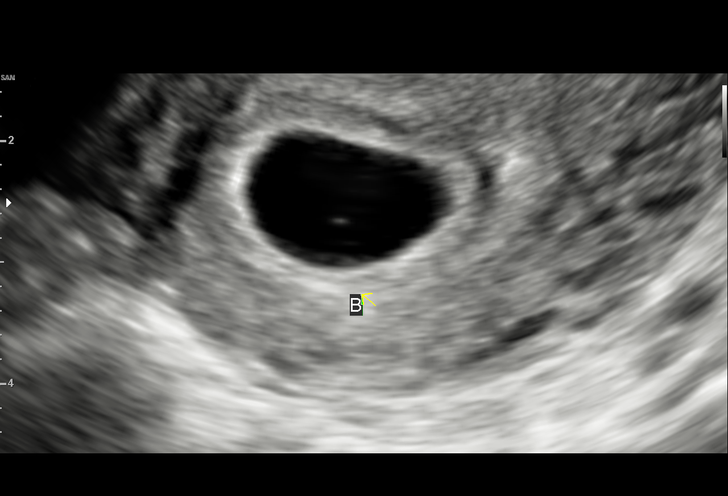
[im 18/33]
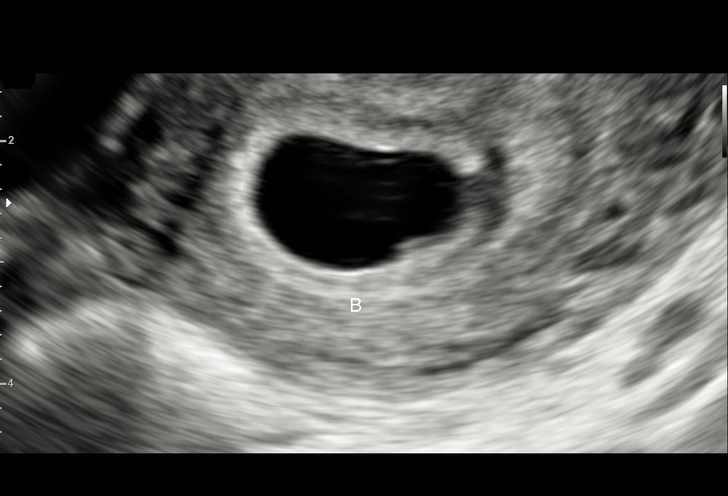
[im 21/33]
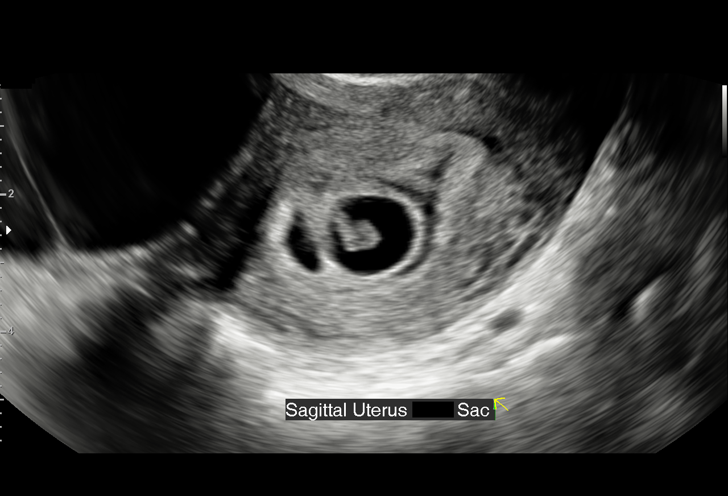
[im 23/33]
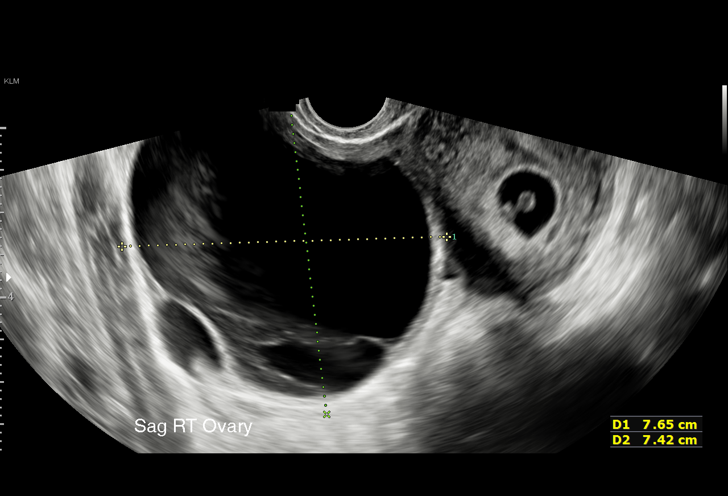
[im 25/33]
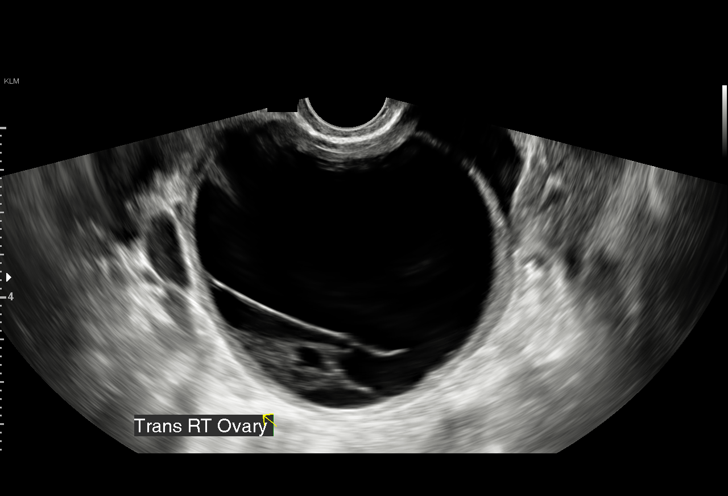
[im 28/33]
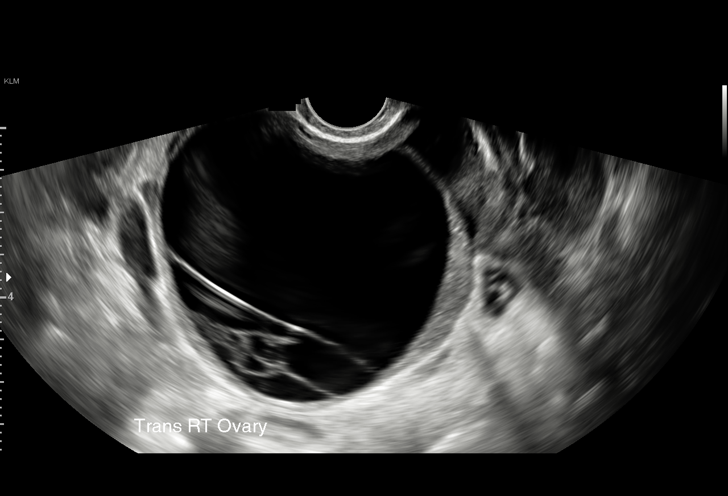
[im 30/33]
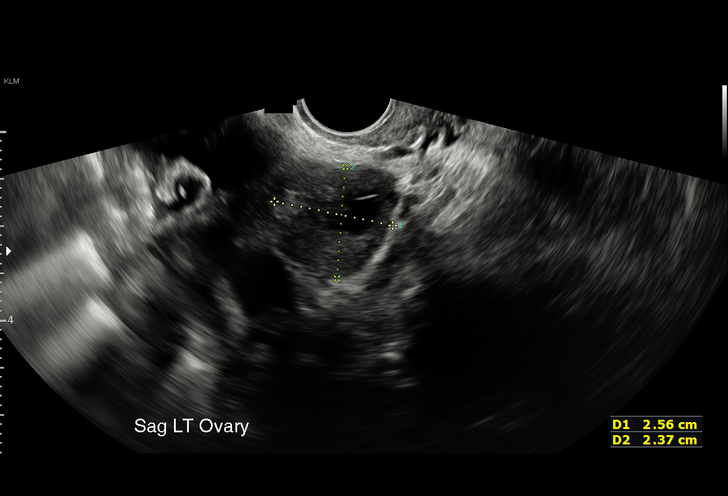
[im 33/33]
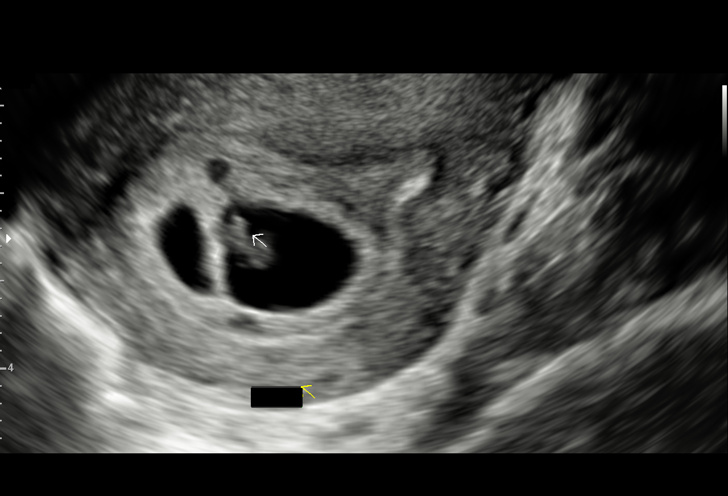

[15 of 28 positions shown; findings below may reference images not displayed]

FINDINGS: Number of IUPs:  2

Chorionicity/Amnionicity:  Dichorionic-diamniotic (thick membrane)

TWIN 1

Yolk sac:  Visualized.

Embryo:  Visualized.

Cardiac Activity: Visualized.

Heart Rate: 120 bpm

CRL:   5.8 mm   6 w 2 d                  US EDC: 01/06/2021

TWIN 2

Yolk sac:  Not Visualized.

Embryo:  Not Visualized.

MSD: 1.2 mm   6 w   0 d

Subchorionic hemorrhage:  None visualized.

Maternal uterus/adnexae: Again noted is a multilocular septated cyst
within the right ovary measuring 6.1 x 7.3 x 7.2 cm. There is normal
peripheral flow seen within the right ovary. The left ovary is
normal in appearance. Trace free fluid is seen within the
cul-de-sac.
IMPRESSION: 1. Twin intrauterine gestational sacs. However there is only 1
single live fetal pole measuring 6 weeks 2 days. The second
gestational sac does not contain a yolk sac or fetal pole.
2. Complex multilocular cyst within the right ovary, not
significantly changed since the prior exam.

## 2021-03-18 ENCOUNTER — Encounter: Payer: Self-pay | Admitting: *Deleted

## 2021-03-18 ENCOUNTER — Ambulatory Visit: Payer: Managed Care, Other (non HMO) | Admitting: *Deleted

## 2021-03-18 ENCOUNTER — Other Ambulatory Visit: Payer: Self-pay

## 2021-03-18 ENCOUNTER — Ambulatory Visit: Payer: Managed Care, Other (non HMO) | Attending: Obstetrics

## 2021-03-18 VITALS — BP 119/64 | HR 79

## 2021-03-18 DIAGNOSIS — O4443 Low lying placenta NOS or without hemorrhage, third trimester: Secondary | ICD-10-CM | POA: Diagnosis not present

## 2021-03-18 DIAGNOSIS — O4440 Low lying placenta NOS or without hemorrhage, unspecified trimester: Secondary | ICD-10-CM

## 2021-03-18 DIAGNOSIS — Z3A29 29 weeks gestation of pregnancy: Secondary | ICD-10-CM

## 2021-03-18 DIAGNOSIS — Z362 Encounter for other antenatal screening follow-up: Secondary | ICD-10-CM

## 2021-03-28 ENCOUNTER — Other Ambulatory Visit: Payer: Self-pay

## 2021-03-28 ENCOUNTER — Inpatient Hospital Stay (HOSPITAL_COMMUNITY)
Admission: AD | Admit: 2021-03-28 | Discharge: 2021-03-28 | Disposition: A | Discharge status: Home / Self Care | Payer: Managed Care, Other (non HMO) | Attending: Obstetrics and Gynecology | Admitting: Obstetrics and Gynecology

## 2021-03-28 DIAGNOSIS — O9A213 Injury, poisoning and certain other consequences of external causes complicating pregnancy, third trimester: Secondary | ICD-10-CM | POA: Diagnosis not present

## 2021-03-28 DIAGNOSIS — Z3A31 31 weeks gestation of pregnancy: Secondary | ICD-10-CM | POA: Insufficient documentation

## 2021-03-28 DIAGNOSIS — O99891 Other specified diseases and conditions complicating pregnancy: Secondary | ICD-10-CM | POA: Diagnosis not present

## 2021-03-28 DIAGNOSIS — S20161A Insect bite (nonvenomous) of breast, right breast, initial encounter: Secondary | ICD-10-CM | POA: Insufficient documentation

## 2021-03-28 DIAGNOSIS — W57XXXA Bitten or stung by nonvenomous insect and other nonvenomous arthropods, initial encounter: Secondary | ICD-10-CM | POA: Insufficient documentation

## 2021-03-28 NOTE — MAU Provider Note (Signed)
History     CSN: [id number]  Arrival date and time: 03/28/21 1242   Event Date/Time   First Provider Initiated Contact with Patient 03/28/21 1344      Chief Complaint  Patient presents with   Tick Removal   HPI Sarah [name] is a 25 y.o. G2P0010 at [redacted]w[redacted]d who presents due to a tick bite on her right breast. Believes tick became attached this morning while she was washing her dog at the neighborhood dog spa. Was removed by her significant other. Initially was only able to remove the body of the tick. States he "dug around" and thinks he got the head of the tick out but she is unsure. Denies any ob complaints or symptoms.   OB History     Gravida  2   Para      Term      Preterm      AB  1   Living  0      SAB  1   IAB      Ectopic      Multiple      Live Births              Past Medical History:  Diagnosis Date   Asthma     Past Surgical History:  Procedure Laterality Date   TONSILLECTOMY      Family History  Problem Relation Age of Onset   Healthy Mother    Healthy Father     Social History   Tobacco Use   Smoking status: Never   Smokeless tobacco: Never  Vaping Use   Vaping Use: Never used  Substance Use Topics   Alcohol use: No   Drug use: No    Allergies: No Known Allergies  No medications prior to admission.    Review of Systems  Constitutional: Negative.   Gastrointestinal: Negative.   Genitourinary: Negative.   Physical Exam   Blood pressure 113/67, pulse 90, temperature 98.5 F (36.9 C), temperature source Oral, resp. rate 16, height 5\' 2"  (1.575 m), weight 66.4 kg, last menstrual period 08/23/2020, SpO2 100 %, unknown if currently breastfeeding.  Physical Exam Vitals and nursing note reviewed. Exam conducted with a chaperone present.  Constitutional:      General: She is not in acute distress.    Appearance: Normal appearance. She is normal weight.  HENT:     Head: Normocephalic and atraumatic.  Eyes:      General: No scleral icterus. Pulmonary:     Effort: Pulmonary effort is normal. No respiratory distress.  Chest:     Comments: Abrasion at 4 o'clock of right areola just adjacent to nipple.  Skin:    General: Skin is warm and dry.  Neurological:     Mental Status: She is alert.  Psychiatric:        Mood and Affect: Mood normal.        Behavior: Behavior normal.    MAU Course  Procedures  MDM Pregnant patient presents due to tick bite. No ob complaints. FHT present via doppler.  Brought ziplock bag with tick in it - appears to be a deer tick - is flat & not engorged. Head not attached.  Examined right areola using ophthalmoscope - unable to visualize head. Small abrasion likely due to significant other "digging" out tick head with a needle or tweezers.   Assessment and Plan   1. Tick bite of right breast, initial encounter   2. [redacted] weeks gestation of pregnancy    -  f/u with PCP, urgent care, ob or ED for s/s of tick borne illness.  -f/u with ob as scheduled  Judeth Horn 03/28/2021, 2:32 PM

## 2021-03-28 NOTE — MAU Note (Signed)
Sarah [name] is a 25 y.o. at [redacted]w[redacted]d here in MAU reporting: states when she was her community dog [place] she got a tick on her and it was on her breast. No pain, bleeding, or LOF. +FM  Onset of complaint: today  Pain score: 0/10  Vitals:   03/28/21 1303  BP: 113/67  Pulse: 90  Resp: 16  Temp: 98.5 F (36.9 C)  SpO2: 100%     FHT:148  Lab orders placed from triage: none

## 2021-05-05 LAB — OB RESULTS CONSOLE GBS: GBS: NEGATIVE

## 2021-05-25 ENCOUNTER — Encounter (HOSPITAL_COMMUNITY): Payer: Self-pay | Admitting: Obstetrics and Gynecology

## 2021-05-25 ENCOUNTER — Inpatient Hospital Stay (HOSPITAL_COMMUNITY): Payer: Managed Care, Other (non HMO) | Admitting: Anesthesiology

## 2021-05-25 ENCOUNTER — Encounter (HOSPITAL_COMMUNITY): Payer: Self-pay | Admitting: Obstetrics & Gynecology

## 2021-05-25 ENCOUNTER — Other Ambulatory Visit: Payer: Self-pay

## 2021-05-25 ENCOUNTER — Inpatient Hospital Stay (EMERGENCY_DEPARTMENT_HOSPITAL)
Admission: AD | Admit: 2021-05-25 | Discharge: 2021-05-25 | Disposition: A | Discharge status: Home / Self Care | Payer: Managed Care, Other (non HMO) | Source: Home / Self Care | Attending: Obstetrics and Gynecology | Admitting: Obstetrics and Gynecology

## 2021-05-25 ENCOUNTER — Inpatient Hospital Stay (HOSPITAL_COMMUNITY)
Admission: AD | Admit: 2021-05-25 | Discharge: 2021-05-27 | DRG: 807 | Disposition: A | Discharge status: Home / Self Care | Payer: Managed Care, Other (non HMO) | Attending: Obstetrics & Gynecology | Admitting: Obstetrics & Gynecology

## 2021-05-25 DIAGNOSIS — O9952 Diseases of the respiratory system complicating childbirth: Secondary | ICD-10-CM | POA: Diagnosis present

## 2021-05-25 DIAGNOSIS — L309 Dermatitis, unspecified: Secondary | ICD-10-CM | POA: Insufficient documentation

## 2021-05-25 DIAGNOSIS — O26893 Other specified pregnancy related conditions, third trimester: Secondary | ICD-10-CM | POA: Diagnosis present

## 2021-05-25 DIAGNOSIS — J45909 Unspecified asthma, uncomplicated: Secondary | ICD-10-CM | POA: Diagnosis present

## 2021-05-25 DIAGNOSIS — Z3A39 39 weeks gestation of pregnancy: Secondary | ICD-10-CM

## 2021-05-25 DIAGNOSIS — O471 False labor at or after 37 completed weeks of gestation: Secondary | ICD-10-CM

## 2021-05-25 DIAGNOSIS — R61 Generalized hyperhidrosis: Secondary | ICD-10-CM | POA: Insufficient documentation

## 2021-05-25 DIAGNOSIS — Z20822 Contact with and (suspected) exposure to covid-19: Secondary | ICD-10-CM | POA: Diagnosis present

## 2021-05-25 DIAGNOSIS — D573 Sickle-cell trait: Secondary | ICD-10-CM | POA: Diagnosis present

## 2021-05-25 DIAGNOSIS — O9902 Anemia complicating childbirth: Secondary | ICD-10-CM | POA: Diagnosis present

## 2021-05-25 DIAGNOSIS — O479 False labor, unspecified: Secondary | ICD-10-CM

## 2021-05-25 LAB — CBC
HCT: 39.3 % (ref 36.0–46.0)
Hemoglobin: 13.6 g/dL (ref 12.0–15.0)
MCH: 30.7 pg (ref 26.0–34.0)
MCHC: 34.6 g/dL (ref 30.0–36.0)
MCV: 88.7 fL (ref 80.0–100.0)
Platelets: 290 10*3/uL (ref 150–400)
RBC: 4.43 MIL/uL (ref 3.87–5.11)
RDW: 13.8 % (ref 11.5–15.5)
WBC: 10.4 10*3/uL (ref 4.0–10.5)
nRBC: 0 % (ref 0.0–0.2)

## 2021-05-25 LAB — RESP PANEL BY RT-PCR (FLU A&B, COVID) ARPGX2
Influenza A by PCR: NEGATIVE
Influenza B by PCR: NEGATIVE
SARS Coronavirus 2 by RT PCR: NEGATIVE

## 2021-05-25 LAB — TYPE AND SCREEN
ABO/RH(D): B POS
Antibody Screen: NEGATIVE

## 2021-05-25 MED ORDER — BUDESONIDE 0.25 MG/2ML IN SUSP: 0.2500 mg | Freq: Two times a day (BID) | RESPIRATORY_TRACT | Status: DC

## 2021-05-25 MED ORDER — ACETAMINOPHEN 325 MG PO TABS: 650.0000 mg | ORAL_TABLET | ORAL | Status: DC | PRN

## 2021-05-25 MED ORDER — COCONUT OIL OIL: 1.0000 "application " | TOPICAL_OIL | Status: DC | PRN

## 2021-05-25 MED ORDER — PHENYLEPHRINE 40 MCG/ML (10ML) SYRINGE FOR IV PUSH (FOR BLOOD PRESSURE SUPPORT): 80.0000 ug | PREFILLED_SYRINGE | INTRAVENOUS | Status: DC | PRN

## 2021-05-25 MED ORDER — SOD CITRATE-CITRIC ACID 500-334 MG/5ML PO SOLN: 30.0000 mL | ORAL | Status: DC | PRN

## 2021-05-25 MED ORDER — ONDANSETRON HCL 4 MG/2ML IJ SOLN: 4.0000 mg | Freq: Four times a day (QID) | INTRAMUSCULAR | Status: DC | PRN

## 2021-05-25 MED ORDER — OXYCODONE HCL 5 MG PO TABS: 10.0000 mg | ORAL_TABLET | ORAL | Status: DC | PRN

## 2021-05-25 MED ORDER — PRENATAL MULTIVITAMIN CH
1.0000 | ORAL_TABLET | Freq: Every day | ORAL | Status: DC
  Administered 2021-05-26 – 2021-05-27 (×2): 1 via ORAL
  Filled 2021-05-25 (×2): qty 1

## 2021-05-25 MED ORDER — WITCH HAZEL-GLYCERIN EX PADS: 1.0000 "application " | MEDICATED_PAD | CUTANEOUS | Status: DC | PRN

## 2021-05-25 MED ORDER — OXYCODONE-ACETAMINOPHEN 5-325 MG PO TABS: 1.0000 | ORAL_TABLET | ORAL | Status: DC | PRN

## 2021-05-25 MED ORDER — FLEET ENEMA 7-19 GM/118ML RE ENEM: 1.0000 | ENEMA | Freq: Every day | RECTAL | Status: DC | PRN

## 2021-05-25 MED ORDER — BENZOCAINE-MENTHOL 20-0.5 % EX AERO: 1.0000 "application " | INHALATION_SPRAY | CUTANEOUS | Status: DC | PRN

## 2021-05-25 MED ORDER — LACTATED RINGERS IV SOLN
500.0000 mL | INTRAVENOUS | Status: DC | PRN
  Administered 2021-05-25: 500 mL via INTRAVENOUS

## 2021-05-25 MED ORDER — LACTATED RINGERS IV SOLN: INTRAVENOUS | Status: DC

## 2021-05-25 MED ORDER — BISACODYL 10 MG RE SUPP: 10.0000 mg | Freq: Every day | RECTAL | Status: DC | PRN

## 2021-05-25 MED ORDER — FLEET ENEMA 7-19 GM/118ML RE ENEM: 1.0000 | ENEMA | RECTAL | Status: DC | PRN

## 2021-05-25 MED ORDER — OXYTOCIN-SODIUM CHLORIDE 30-0.9 UT/500ML-% IV SOLN
2.5000 [IU]/h | INTRAVENOUS | Status: DC
  Filled 2021-05-25: qty 500

## 2021-05-25 MED ORDER — FENTANYL-BUPIVACAINE-NACL 0.5-0.125-0.9 MG/250ML-% EP SOLN
12.0000 mL/h | EPIDURAL | Status: DC | PRN
  Administered 2021-05-25: 11.5 mL/h via EPIDURAL

## 2021-05-25 MED ORDER — SODIUM CHLORIDE 0.9% FLUSH: 3.0000 mL | Freq: Two times a day (BID) | INTRAVENOUS | Status: DC

## 2021-05-25 MED ORDER — DIPHENHYDRAMINE HCL 25 MG PO CAPS: 25.0000 mg | ORAL_CAPSULE | Freq: Four times a day (QID) | ORAL | Status: DC | PRN

## 2021-05-25 MED ORDER — SODIUM CHLORIDE 0.9% FLUSH: 3.0000 mL | INTRAVENOUS | Status: DC | PRN

## 2021-05-25 MED ORDER — LACTATED RINGERS IV SOLN: 500.0000 mL | Freq: Once | INTRAVENOUS | Status: DC

## 2021-05-25 MED ORDER — DIPHENHYDRAMINE HCL 50 MG/ML IJ SOLN: 12.5000 mg | INTRAMUSCULAR | Status: DC | PRN

## 2021-05-25 MED ORDER — LIDOCAINE HCL (PF) 1 % IJ SOLN
INTRAMUSCULAR | Status: DC | PRN
  Administered 2021-05-25 (×2): 4 mL via EPIDURAL

## 2021-05-25 MED ORDER — LIDOCAINE HCL (PF) 1 % IJ SOLN
30.0000 mL | INTRAMUSCULAR | Status: DC | PRN
Start: 2021-05-25 — End: 2021-05-25

## 2021-05-25 MED ORDER — IBUPROFEN 600 MG PO TABS
600.0000 mg | ORAL_TABLET | Freq: Four times a day (QID) | ORAL | Status: DC
  Administered 2021-05-25 – 2021-05-27 (×8): 600 mg via ORAL
  Filled 2021-05-25 (×8): qty 1

## 2021-05-25 MED ORDER — OXYTOCIN BOLUS FROM INFUSION
333.0000 mL | Freq: Once | INTRAVENOUS | Status: AC
  Administered 2021-05-25: 333 mL via INTRAVENOUS

## 2021-05-25 MED ORDER — PHENYLEPHRINE 40 MCG/ML (10ML) SYRINGE FOR IV PUSH (FOR BLOOD PRESSURE SUPPORT)
80.0000 ug | PREFILLED_SYRINGE | INTRAVENOUS | Status: DC | PRN
Start: 2021-05-25 — End: 2021-05-25

## 2021-05-25 MED ORDER — EPHEDRINE 5 MG/ML INJ: 10.0000 mg | INTRAVENOUS | Status: DC | PRN

## 2021-05-25 MED ORDER — OXYCODONE HCL 5 MG PO TABS: 5.0000 mg | ORAL_TABLET | ORAL | Status: DC | PRN

## 2021-05-25 MED ORDER — DIBUCAINE (PERIANAL) 1 % EX OINT: 1.0000 "application " | TOPICAL_OINTMENT | CUTANEOUS | Status: DC | PRN

## 2021-05-25 MED ORDER — SODIUM CHLORIDE 0.9 % IV SOLN: 250.0000 mL | INTRAVENOUS | Status: DC | PRN

## 2021-05-25 MED ORDER — ONDANSETRON HCL 4 MG PO TABS: 4.0000 mg | ORAL_TABLET | ORAL | Status: DC | PRN

## 2021-05-25 MED ORDER — SENNOSIDES-DOCUSATE SODIUM 8.6-50 MG PO TABS
2.0000 | ORAL_TABLET | ORAL | Status: DC
  Administered 2021-05-27: 2 via ORAL
  Filled 2021-05-25 (×2): qty 2

## 2021-05-25 MED ORDER — OXYCODONE-ACETAMINOPHEN 5-325 MG PO TABS: 2.0000 | ORAL_TABLET | ORAL | Status: DC | PRN

## 2021-05-25 MED ORDER — FENTANYL-BUPIVACAINE-NACL 0.5-0.125-0.9 MG/250ML-% EP SOLN
EPIDURAL | Status: AC
  Filled 2021-05-25: qty 250

## 2021-05-25 MED ORDER — ONDANSETRON HCL 4 MG/2ML IJ SOLN: 4.0000 mg | INTRAMUSCULAR | Status: DC | PRN

## 2021-05-25 MED ORDER — SIMETHICONE 80 MG PO CHEW: 80.0000 mg | CHEWABLE_TABLET | ORAL | Status: DC | PRN

## 2021-05-25 NOTE — Lactation Note (Signed)
This note was copied from a baby's chart. Lactation Consultation Note  Patient Name: Sarah [name] Date: 05/25/2021 Reason for consult: Initial assessment;Mother's request;Primapara;1st time breastfeeding Age:24 hours  LC did some suck training to extend tongue down. Mom in midst of feeding with audible swallows.   Plan 1. To feed based on cues 8-12x in 24 hr period no more than 4 hrs without an attempt. Mom to offer both breasts and look for signs of milk transfer.  2. If unable to latch, Mom to offer EBM via spoon.  3. I and O sheet reviewed.  4. LC brochure of inpatient and outpatient services reviewed.  All questions answered at the end of the visit.   Maternal Data Has patient been taught Hand Expression?: Yes Does the patient have breastfeeding experience prior to this delivery?: No  Feeding Mother's Current Feeding Choice: Breast Milk  LATCH Score Latch: Repeated attempts needed to sustain latch, nipple held in mouth throughout feeding, stimulation needed to elicit sucking reflex.  Audible Swallowing: Spontaneous and intermittent  Type of Nipple: Everted at rest and after stimulation  Comfort (Breast/Nipple): Soft / non-tender  Hold (Positioning): Assistance needed to correctly position infant at breast and maintain latch.  LATCH Score: 8   Lactation Tools Discussed/Used    Interventions Interventions: Breast feeding basics reviewed;Breast compression;Assisted with latch;Adjust position;Skin to skin;Support pillows;Breast massage;Position options;Hand express;Expressed milk;Education  Discharge    Consult Status Consult Status: Follow-up Date: 05/26/21 Follow-up type: In-patient    Sarah [name]  Sarah [name] 05/25/2021, 10:50 PM

## 2021-05-25 NOTE — H&P (Signed)
Sarah [name] is a 25 y.o. female G2P0010 [redacted]w[redacted]d presenting for labor. Frequent contractions. She reports no LOF, VB. Normal FM.   Pregnancy c/b: Sickle cell trait - reports father of baby negative Asthma - on daily inhaler, albuterol prn  OB History     Gravida  2   Para      Term      Preterm      AB  1   Living  0      SAB  1   IAB      Ectopic      Multiple      Live Births             Past Medical History:  Diagnosis Date   Asthma    Past Surgical History:  Procedure Laterality Date   TONSILLECTOMY     Family History: family history includes Healthy in her father and mother. Social History:  reports that she has never smoked. She has never used smokeless tobacco. She reports that she does not drink alcohol and does not use drugs.     Maternal Diabetes: No Genetic Screening: Panorama low risk Maternal Ultrasounds/Referrals: Normal - low lying placenta resolved Fetal Ultrasounds or other Referrals:  Referred to Materal Fetal Medicine  for anatomy Maternal Substance Abuse:  No Significant Maternal Medications:  Meds include: Other:  Significant Maternal Lab Results:  Other:  inhalers Other Comments:   sickle cell trait  Review of Systems Per HPI Exam Physical Exam  Dilation: 9 Effacement (%): 100 Station: -1 Exam by:: Dia Sitter RN Blood pressure 116/71, pulse 81, temperature 98.2 F (36.8 C), temperature source Oral, resp. rate 16, height 5\' 2"  (1.575 m), last menstrual period 08/23/2020, SpO2 100 %, unknown if currently breastfeeding. NAD, resting comfortably Gravid abdomen Card RRR Pulm CTAB Lower extremity trace edema, no evidence of DVT Fetal testing: FHR 130, Cat 1. Toco q40m Prenatal labs: ABO, Rh:  --/--/PENDING (08/08 1016) Antibody: PENDING (08/08 1016) Rubella: Immune (02/03 0000) RPR: Nonreactive (05/10 0000)  HBsAg: Negative (02/03 0000)  HIV: Non-reactive (02/03 0000)  GBS: Negative/-- (07/19 0000)    Hematology Recent Labs  Lab 05/25/21 1030  WBC 10.4  RBC 4.43  HGB 13.6  HCT 39.3  MCV 88.7  MCH 30.7  MCHC 34.6  RDW 13.8  PLT 290    Assessment/Plan: Sarah [name] is a 25 y.o. female G2P0010 [redacted]w[redacted]d admitted with labor.  On admit was 7cm, laboring spontaneously, now 10/100/+2, s/p AROM.   Vaccines: s/p tdap, flu. Has not had COVID vaccines  Shaul Trautman K Taam-Akelman 05/25/2021, 12:18 PM

## 2021-05-25 NOTE — Lactation Note (Signed)
This note was copied from a baby's chart. Lactation Consultation Note  Patient Name: Sarah [name] Today's Date: 05/25/2021 Reason for consult: L&D Initial assessment;Term;Primapara Age:24 hours  Mom is a P1 who reports + breast changes w/pregnancy. RN had helped infant to latch; Mom was comfortable. Swallows verified by cervical auscultation.   Mom has a manual pump at home. Mom's questions were answered.  Lactation to f/u later today.   [name] [facility] 05/25/2021, 2:23 PM

## 2021-05-25 NOTE — Anesthesia Preprocedure Evaluation (Addendum)
Anesthesia Evaluation  Patient identified by MRN, date of birth, ID band Patient awake    Reviewed: Allergy & Precautions, Patient's Chart, lab work & pertinent test results  Airway Mallampati: II  TM Distance: >3 FB Neck ROM: Full   Comment: Pierced nose Dental no notable dental hx. (+) Teeth Intact, Dental Advisory Given   Pulmonary asthma ,    Pulmonary exam normal breath sounds clear to auscultation       Cardiovascular negative cardio ROS   Rhythm:Regular Rate:Normal     Neuro/Psych negative neurological ROS  negative psych ROS   GI/Hepatic negative GI ROS, Neg liver ROS,   Endo/Other  negative endocrine ROS  Renal/GU negative Renal ROS  negative genitourinary   Musculoskeletal negative musculoskeletal ROS (+)   Abdominal   Peds  Hematology negative hematology ROS (+)   Anesthesia Other Findings   Reproductive/Obstetrics (+) Pregnancy                            Anesthesia Physical Anesthesia Plan  ASA: 2  Anesthesia Plan: Epidural   Post-op Pain Management:    Induction:   PONV Risk Score and Plan:   Airway Management Planned: Natural Airway  Additional Equipment:   Intra-op Plan:   Post-operative Plan:   Informed Consent: I have reviewed the patients History and Physical, chart, labs and discussed the procedure including the risks, benefits and alternatives for the proposed anesthesia with the patient or authorized representative who has indicated his/her understanding and acceptance.       Plan Discussed with: Anesthesiologist  Anesthesia Plan Comments:         Anesthesia Quick Evaluation

## 2021-05-25 NOTE — MAU Provider Note (Signed)
S: Patient is here for RN labor evaluation. Strip, vital signs, & chart Reviewed   O:  Vitals:   05/25/21 0315 05/25/21 0320 05/25/21 0325 05/25/21 0345  BP:    123/80  Pulse:    70  Resp:    16  Temp:    98.7 F (37.1 C)  TempSrc:    Oral  SpO2: 100% 100% 100%   Weight:      Height:       No results found for this or any previous visit (from the past 24 hour(s)).  Dilation: 2.5 Effacement (%): 70, 80 Cervical Position: Posterior Station: -2 Presentation: Vertex Exam by:: benji stanley RN   FHR: 135 bpm, Mod Var, no Decels, 15x15 Accels UC: Q 6-10 mins & UI   A: 1. False labor   2. [redacted] weeks gestation of pregnancy   -Cervix unchanged while in MAU. Patient reports decrease in ctx intensity & requesting to be discharged home    P:  RN to discharge home in stable condition with return precautions & fetal kick counts  Judeth Horn FNP 3:47 AM

## 2021-05-25 NOTE — MAU Note (Addendum)
Pt reports to MAU with c/o lower back pain that is intermittent that started around 2130.  Pt also reports bloody show when she wiped. Last intercourse was a day ago.   +FM

## 2021-05-25 NOTE — Anesthesia Procedure Notes (Signed)
Epidural Patient location during procedure: OB Start time: 05/25/2021 11:03 AM End time: 05/25/2021 11:13 AM  Staffing Anesthesiologist: Mal Amabile, MD Performed: anesthesiologist   Preanesthetic Checklist Completed: patient identified, IV checked, site marked, risks and benefits discussed, surgical consent, monitors and equipment checked, pre-op evaluation and timeout performed  Epidural Patient position: sitting Prep: DuraPrep and site prepped and draped Patient monitoring: continuous pulse ox and blood pressure Approach: midline Location: L3-L4 Injection technique: LOR air  Needle:  Needle type: Tuohy  Needle gauge: 17 G Needle length: 9 cm and 9 Needle insertion depth: 5 cm Catheter type: closed end flexible Catheter size: 19 Gauge Catheter at skin depth: 10 cm Test dose: negative and Other  Assessment Events: blood not aspirated, injection not painful, no injection resistance, no paresthesia and negative IV test  Additional Notes Patient identified. Risks and benefits discussed including failed block, incomplete  Pain control, post dural puncture headache, nerve damage, paralysis, blood pressure Changes, nausea, vomiting, reactions to medications-both toxic and allergic and post Partum back pain. All questions were answered. Patient expressed understanding and wished to proceed. Sterile technique was used throughout procedure. Epidural site was Dressed with sterile barrier dressing. No paresthesias, signs of intravascular injection Or signs of intrathecal spread were encountered.  Patient was more comfortable after the epidural was dosed. Please see RN's note for documentation of vital signs and FHR which are stable. Reason for block:procedure for pain

## 2021-05-26 LAB — CBC
HCT: 35.2 % — ABNORMAL LOW (ref 36.0–46.0)
Hemoglobin: 12.2 g/dL (ref 12.0–15.0)
MCH: 31.1 pg (ref 26.0–34.0)
MCHC: 34.7 g/dL (ref 30.0–36.0)
MCV: 89.8 fL (ref 80.0–100.0)
Platelets: 274 10*3/uL (ref 150–400)
RBC: 3.92 MIL/uL (ref 3.87–5.11)
RDW: 14.1 % (ref 11.5–15.5)
WBC: 13 10*3/uL — ABNORMAL HIGH (ref 4.0–10.5)
nRBC: 0 % (ref 0.0–0.2)

## 2021-05-26 LAB — RPR: RPR Ser Ql: NONREACTIVE

## 2021-05-26 NOTE — Progress Notes (Signed)
Patient is eating, ambulating, voiding.  Pain control is good.  Vitals:   05/25/21 1633 05/25/21 2030 05/26/21 0030 05/26/21 0500  BP: 119/70 112/61 119/65 109/69  Pulse: 79 76 80 71  Resp: 20 18 18    Temp: 99 F (37.2 C) 98.7 F (37.1 C) 98.6 F (37 C) 98.4 F (36.9 C)  TempSrc: Oral Oral Oral   SpO2:  100% 100% 100%  Height:        Fundus firm Perineum without swelling.  Lab Results  Component Value Date   WBC 13.0 (H) 05/26/2021   HGB 12.2 05/26/2021   HCT 35.2 (L) 05/26/2021   MCV 89.8 05/26/2021   PLT 274 05/26/2021    --/--/B POS (08/08 1016)/RI  A/P Post partum day 1.  Routine care.  Expect d/c tomorrow.    07-17-1992

## 2021-05-26 NOTE — Anesthesia Postprocedure Evaluation (Signed)
Anesthesia Post Note  Patient: Psychologist, clinical  Procedure(s) Performed: AN AD HOC LABOR EPIDURAL     Patient location during evaluation: Mother Baby Anesthesia Type: Epidural Level of consciousness: awake, oriented and awake and alert Pain management: pain level controlled Vital Signs Assessment: post-procedure vital signs reviewed and stable Respiratory status: spontaneous breathing, respiratory function stable and nonlabored ventilation Cardiovascular status: stable Postop Assessment: no headache, adequate PO intake, able to ambulate, no backache, patient able to bend at knees and no apparent nausea or vomiting Anesthetic complications: no   No notable events documented.  Last Vitals:  Vitals:   05/26/21 0030 05/26/21 0500  BP: 119/65 109/69  Pulse: 80 71  Resp: 18   Temp: 37 C 36.9 C  SpO2: 100% 100%    Last Pain:  Vitals:   05/26/21 0710  TempSrc:   PainSc: 0-No pain   Pain Goal: Patients Stated Pain Goal: 3 (05/25/21 1054)                 Sarah [name]

## 2021-05-26 NOTE — Lactation Note (Signed)
This note was copied from a baby's chart. Lactation Consultation Note  Patient Name: Sarah [name] [id number] Date: 05/26/2021 Reason for consult: Follow-up assessment;Primapara;Term Age:24 hours  Mom assisted with latching; infant latches with relative ease. I showed Mom how to do breast compressions to increase frequency of swallows, which helped. Mom taught the sound/signs of swallows. When infant released latch, Mom's nipple was rounded.  When infant relatched soon thereafter, infant had short sucking bursts with longer pauses, with less swallows noted.  Mom's questions answered to her satisfaction. Size 24 flanges on her hand pump are appropriate.  [name] [facility] 05/26/2021, 1:45 PM

## 2021-05-27 MED ORDER — IBUPROFEN 200 MG PO TABS: 600.0000 mg | ORAL_TABLET | Freq: Four times a day (QID) | ORAL | Status: DC | PRN

## 2021-05-27 MED ORDER — ACETAMINOPHEN 325 MG PO TABS: 650.0000 mg | ORAL_TABLET | ORAL | Status: DC | PRN

## 2021-05-27 NOTE — Lactation Note (Signed)
This note was copied from a baby's chart. Lactation Consultation Note  Patient Name: Girl Aleisa Howk [id number] Date: 05/27/2021 Reason for consult: Follow-up assessment Age:24 hours   P1 mother whose infant is now 76 hours old.  This is a term baby at 39+2 weeks.  Baby has been discharged.  Reviewed feeding plan for after discharge.  Mother will continue to feed 8-12 times/24 hours or with feeding cues.  Answered questions mother had regarding milk coming to volume and general breast feeding concerns.  Mother has private insurance.  Explained how to make an OP LC visit as needed after discharge.  She also has our OP phone number for general questions/concerns.  Engorgement prevention/treatment reviewed.  Mother has a manual pump and a DEBP for home use.  No support person present at this time.  Mother's boyfriend will arrive later today.   Maternal Data    Feeding    LATCH Score                    Lactation Tools Discussed/Used    Interventions Interventions: Education  Discharge Discharge Education: Engorgement and breast care  Consult Status Consult Status: Complete Date: 05/27/21 Follow-up type: Call as needed    Irene Pap Rasheen Bells 05/27/2021, 11:37 AM

## 2021-05-27 NOTE — Progress Notes (Signed)
Post Partum Day 2 Subjective: Sarah [name] is doing great this morning without complaints. Ambulating, voiding, tolerating PO. Appropriate lochia. Breastfeeding. Eager for discharge home.   Objective: Patient Vitals for the past 24 hrs:  BP Temp Temp src Pulse Resp SpO2  05/27/21 0608 109/71 98.1 F (36.7 C) Oral 64 16 100 %  05/26/21 2113 108/61 98.2 F (36.8 C) Oral 78 18 100 %  05/26/21 1357 (!) 101/52 98.4 F (36.9 C) Oral 89 20 --    Physical Exam:  General: alert, cooperative, and no distress Lochia: appropriate Uterine Fundus: firm DVT Evaluation: No evidence of DVT seen on physical exam.  Recent Labs    05/25/21 1030 05/26/21 0412  WBC 10.4 13.0*  HGB 13.6 12.2  HCT 39.3 35.2*  PLT 290 274    No results for input(s): NA, K, CL, CO2CT, BUN, CREATININE, GLUCOSE, BILITOT, ALT, AST, ALKPHOS, PROT, ALBUMIN in the last 72 hours.  No results for input(s): CALCIUM, MG, PHOS in the last 72 hours.  No results for input(s): PROTIME, APTT, INR in the last 72 hours.  No results for input(s): PROTIME, APTT, INR, FIBRINOGEN in the last 72 hours. Assessment/Plan:  Sarah [name] 25 y.o. G2P1011 PPD#2 sp SVD 1. PPC: continue routine postpartum care 2. Rh pos, rubella immune. S/p tdap vaccine prenatally.  3. Dispo: stable for discharge home. Discharge instructions reviewed.    LOS: 2 days   Charlett Nose 05/27/2021, 9:57 AM

## 2021-05-27 NOTE — Discharge Summary (Signed)
Postpartum Discharge Summary  Date of Service updated 05/27/21     Patient Name: Sarah [name] DOB: 01-19-1996 MRN: [id number]  Date of admission: 05/25/2021 Delivery date:05/25/2021  Delivering provider: Lyda Kalata K  Date of discharge: 05/27/2021  Admitting diagnosis: Normal labor [O80, Z37.9] Intrauterine pregnancy: [redacted]w[redacted]d    Secondary diagnosis:  Active Problems:   Normal labor  Additional problems: none    Discharge diagnosis: Term Pregnancy Delivered                                              Post partum procedures: none Augmentation: N/A Complications: None  Hospital course: Onset of Labor With Vaginal Delivery      25y.o. yo G2P1011 at 329w2das admitted in Active Labor on 05/25/2021. Patient had an uncomplicated labor course as follows:  Membrane Rupture Time/Date: 1:10 PM ,05/25/2021   Delivery Method:Vaginal, Spontaneous  Episiotomy: None  Lacerations:  None  Patient had an uncomplicated postpartum course.  She is ambulating, tolerating a regular diet, passing flatus, and urinating well. Patient is discharged home in stable condition on 05/27/21.  Newborn Data: Birth date:05/25/2021  Birth time:1:45 PM  Gender:Female  Living status:Living  Apgars:9 ,9  Weight:2855 g   Magnesium Sulfate received: No BMZ received: No Rhophylac:N/A MMR:N/A T-DaP:Given prenatally Flu: No Transfusion:No  Physical exam  Vitals:   05/26/21 0500 05/26/21 1357 05/26/21 2113 05/27/21 0608  BP: 109/69 (!) 101/52 108/61 109/71  Pulse: 71 89 78 64  Resp:  20 18 16   Temp: 98.4 F (36.9 C) 98.4 F (36.9 C) 98.2 F (36.8 C) 98.1 F (36.7 C)  TempSrc:  Oral Oral Oral  SpO2: 100%  100% 100%  Height:       General: alert, cooperative, and no distress Lochia: appropriate Uterine Fundus: firm Incision: N/A DVT Evaluation: No evidence of DVT seen on physical exam. Labs: Lab Results  Component Value Date   WBC 13.0 (H) 05/26/2021   HGB 12.2 05/26/2021   HCT  35.2 (L) 05/26/2021   MCV 89.8 05/26/2021   PLT 274 05/26/2021   CMP Latest Ref Rng & Units 05/07/2020  Glucose 70 - 99 mg/dL 80  BUN 6 - 20 mg/dL 10  Creatinine 0.44 - 1.00 mg/dL 0.67  Sodium 135 - 145 mmol/L 139  Potassium 3.5 - 5.1 mmol/L 3.7  Chloride 98 - 111 mmol/L 104  CO2 22 - 32 mmol/L 26  Calcium 8.9 - 10.3 mg/dL 9.6  Total Protein 6.5 - 8.1 g/dL 7.6  Total Bilirubin 0.3 - 1.2 mg/dL 1.0  Alkaline Phos 38 - 126 U/L 49  AST 15 - 41 U/L 15  ALT 0 - 44 U/L 14   Edinburgh Score: Edinburgh Postnatal Depression Scale Screening Tool 05/27/2021  I have been able to laugh and see the funny side of things. 0  I have looked forward with enjoyment to things. 0  I have blamed myself unnecessarily when things went wrong. 0  I have been anxious or worried for no good reason. 0  I have felt scared or panicky for no good reason. 0  Things have been getting on top of me. 0  I have been so unhappy that I have had difficulty sleeping. 0  I have felt sad or miserable. 0  I have been so unhappy that I have been crying. 0  The thought  of harming myself has occurred to me. 0  Edinburgh Postnatal Depression Scale Total 0      After visit meds:  Allergies as of 05/27/2021   No Known Allergies      Medication List     TAKE these medications    acetaminophen 325 MG tablet Commonly known as: Tylenol Take 2 tablets (650 mg total) by mouth every 4 (four) hours as needed (for pain scale < 4).   albuterol 108 (90 Base) MCG/ACT inhaler Commonly known as: VENTOLIN HFA Inhale into the lungs every 6 (six) hours as needed for wheezing or shortness of breath.   fluticasone 110 MCG/ACT inhaler Commonly known as: FLOVENT HFA Inhale 2 puffs into the lungs 2 (two) times daily.   ibuprofen 200 MG tablet Commonly known as: ADVIL Take 3 tablets (600 mg total) by mouth every 6 (six) hours as needed.   prenatal multivitamin Tabs tablet Take 1 tablet by mouth daily at 12 noon.          Discharge home in stable condition Infant Feeding: Breast Infant Disposition:home with mother Discharge instruction: per After Visit Summary and Postpartum booklet. Activity: Advance as tolerated. Pelvic rest for 6 weeks.  Diet: routine diet Anticipated Birth Control: Unsure Postpartum Appointment:4 weeks Additional Postpartum F/U:  none Future Appointments:No future appointments. Follow up Visit:  Follow-up Information     Ob/Gyn, [facility] Follow up in 4 week(s).   Contact information: [address] [place] [address] [phone]                     05/27/2021 Rowland Lathe, MD

## 2021-06-06 ENCOUNTER — Telehealth (HOSPITAL_COMMUNITY): Payer: Self-pay | Admitting: *Deleted

## 2021-06-06 NOTE — Telephone Encounter (Signed)
Hospital discharge follow-up call attempted. Voicemail box full. Deforest Hoyles, RN, 06/06/21, [phone].

## 2021-09-24 DIAGNOSIS — S61201D Unspecified open wound of left index finger without damage to nail, subsequent encounter: Secondary | ICD-10-CM | POA: Diagnosis not present

## 2021-11-04 IMAGING — US US MFM OB COMP +14 WKS
1 series · 13 of 28 positions shown · non-contrast
Comparison: none

[Series 1: us mfm ob comp +14 wks · 13 of 116 slices shown]
[im 5/116]
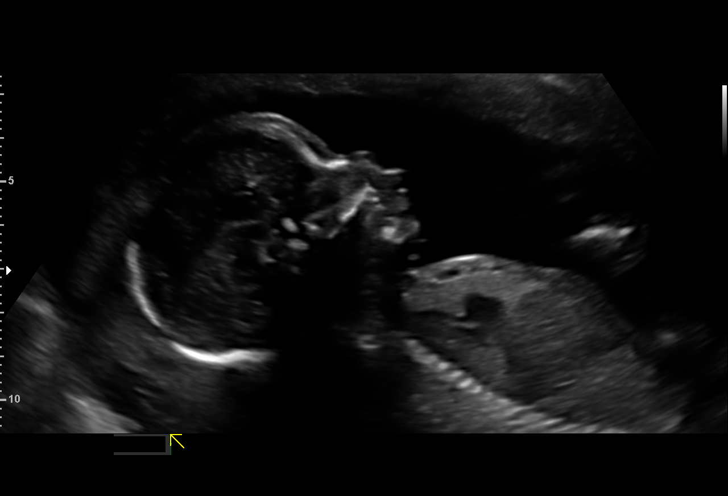
[im 13/116]
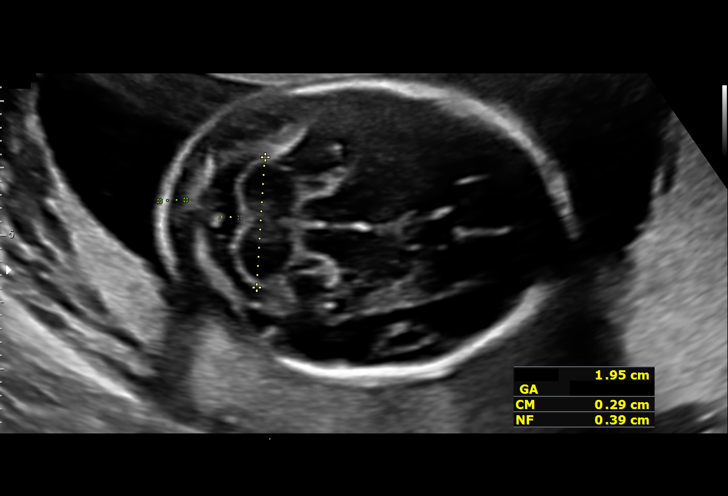
[im 22/116]
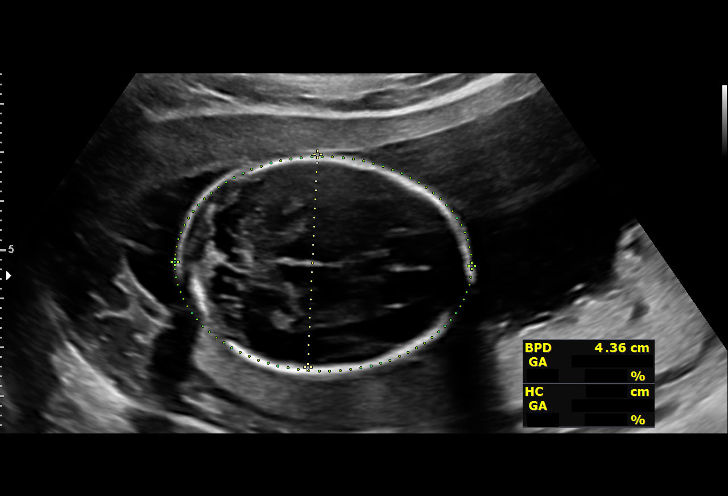
[im 30/116]
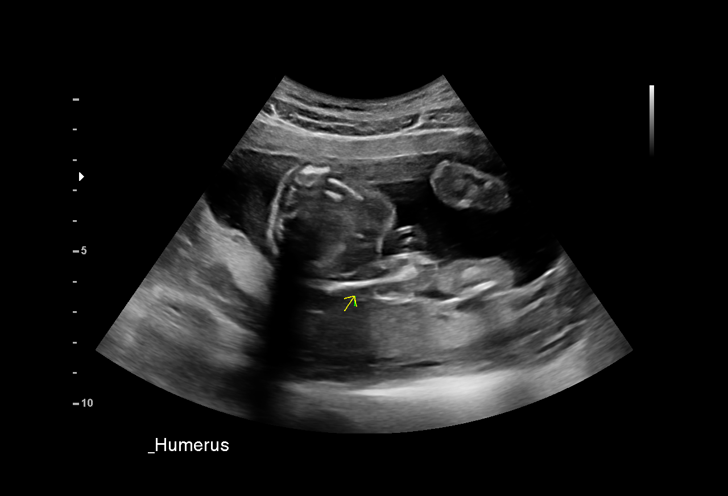
[im 39/116]
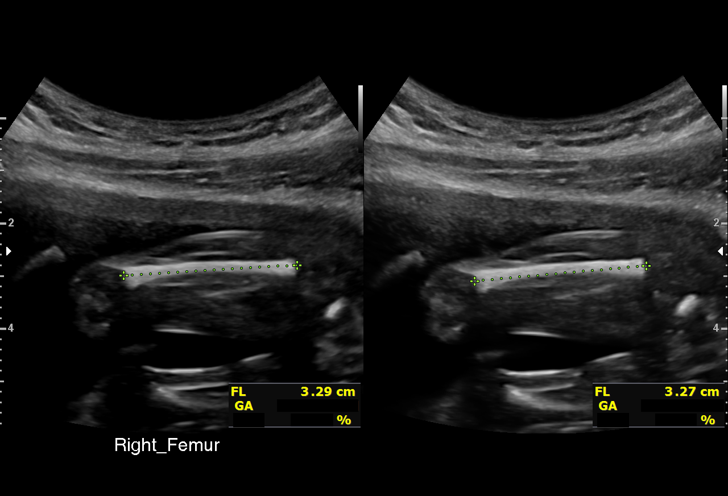
[im 47/116]
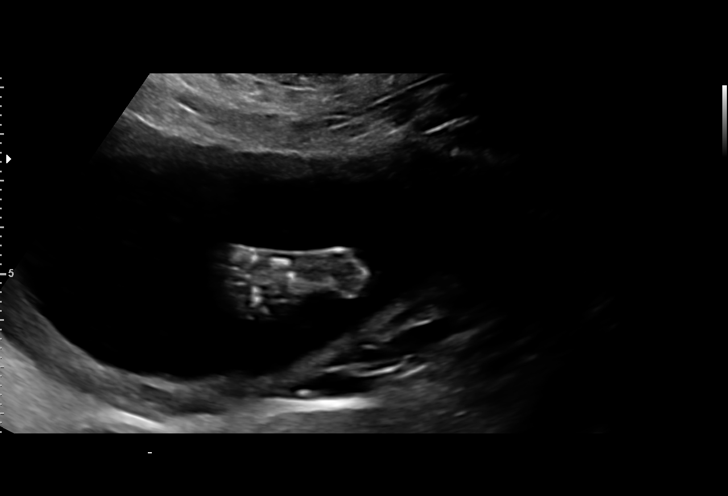
[im 60/116]
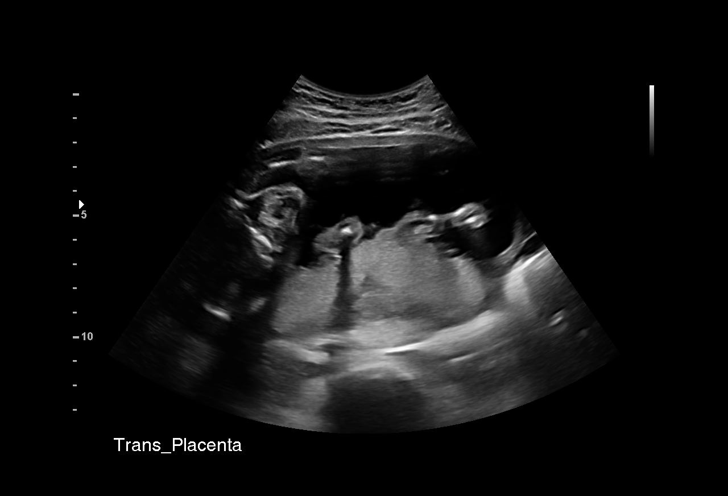
[im 69/116]
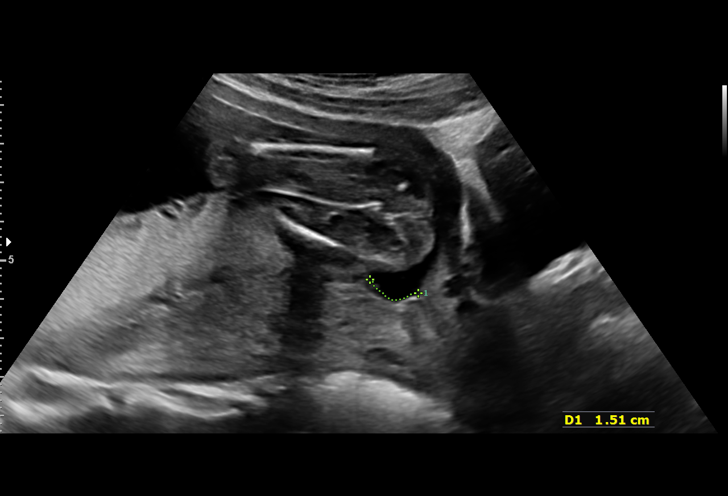
[im 77/116]
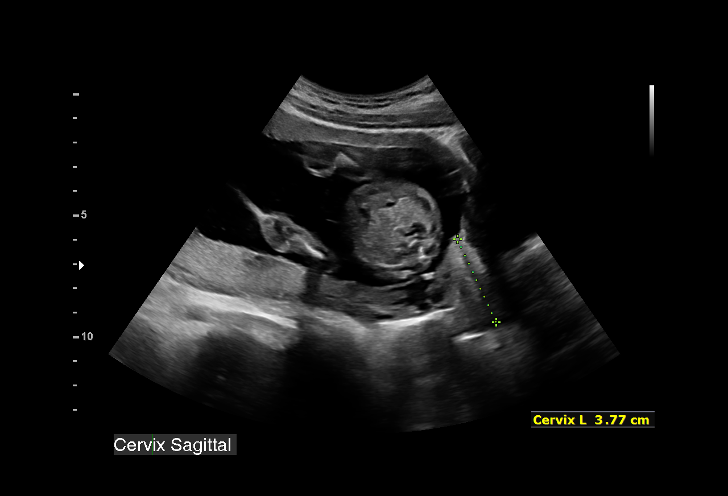
[im 86/116]
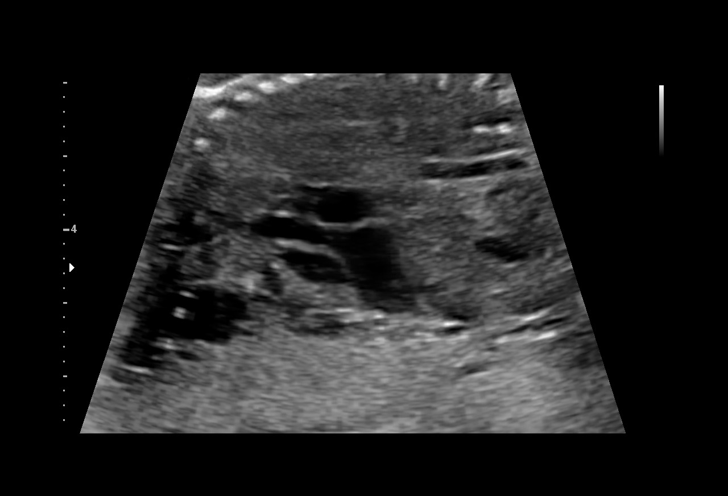
[im 94/116]
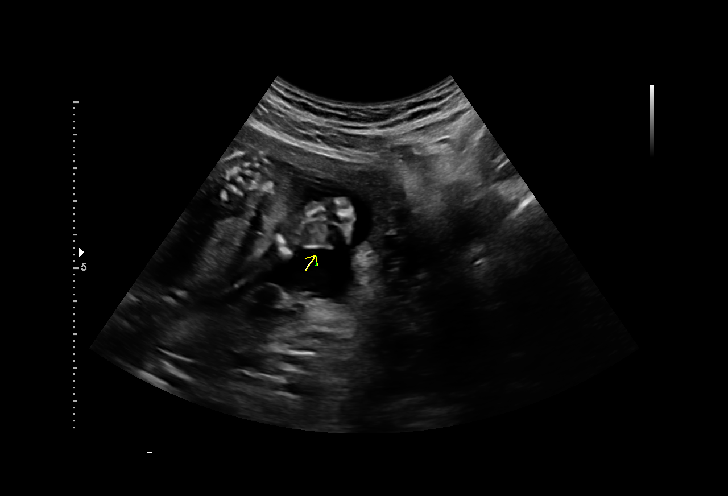
[im 103/116]
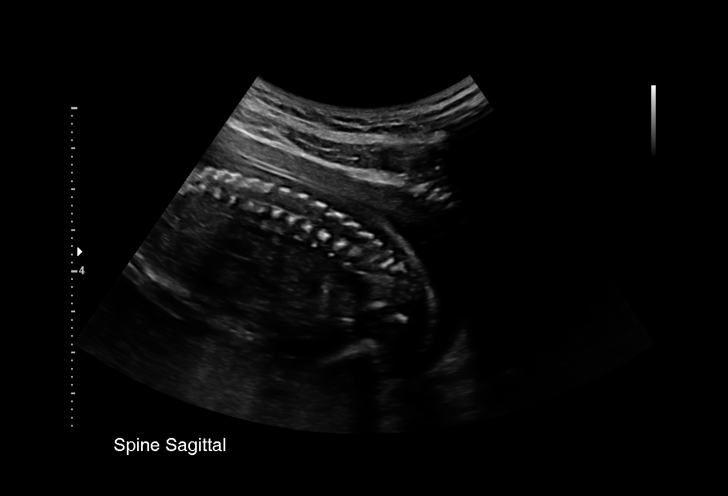
[im 111/116]
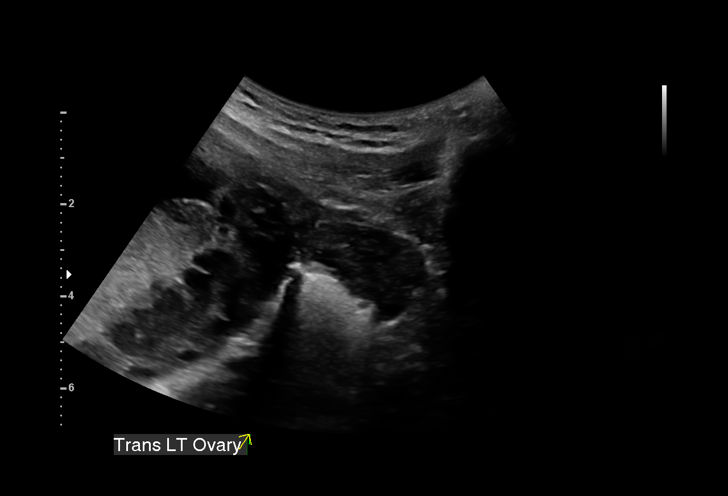

[13 of 28 positions shown; findings below may reference images not displayed]

YUKLUN DO

 1  US MFM OB COMP + 14 WK                76805.01    CHARLEINE COURAGE

Indications

 Encounter for antenatal screening for
 malformations
 19 weeks gestation of pregnancy
Fetal Evaluation

 Num Of Fetuses:         1
 Fetal Heart Rate(bpm):  148
 Cardiac Activity:       Observed
 Presentation:           Transverse, head to maternal right
 Placenta:               Posterior, low-lying, 1.1 cm from int os
 P. Cord Insertion:      Visualized

 Amniotic Fluid
 AFI FV:      Within normal limits

                             Largest Pocket(cm)

Biometry

 BPD:      43.5  mm     G. Age:  19w 1d         45  %    CI:        70.19   %    70 - 86
                                                         FL/HC:      19.7   %    16.1 -
 HC:      165.6  mm     G. Age:  19w 2d         41  %    HC/AC:      1.11        1.09 -
 AC:       149   mm     G. Age:  20w 1d         74  %    FL/BPD:     75.2   %
 FL:       32.7  mm     G. Age:  20w 1d         76  %    FL/AC:      21.9   %    20 - 24
 HUM:      30.9  mm     G. Age:  20w 2d         76  %
 CER:      19.4  mm     G. Age:  18w 6d         32  %
 NFT:       5.9  mm
 LV:        7.4  mm
 CM:        2.8  mm

 Est. FW:     329  gm    0 lb 12 oz      87  %
OB History

 Gravidity:    2          SAB:   1
Gestational Age

 LMP:           19w 2d        Date:  08/23/20                 EDD:   05/30/21
 U/S Today:     19w 5d                                        EDD:   05/27/21
 Best:          19w 2d     Det. By:  LMP  (08/23/20)          EDD:   05/30/21
Anatomy

 Cranium:               Appears normal         LVOT:                   Appears normal
 Cavum:                 Appears normal         Aortic Arch:            Not well visualized
 Ventricles:            Appears normal         Ductal Arch:            Not well visualized
 Choroid Plexus:        Appears normal         Diaphragm:              Appears normal
 Cerebellum:            Appears normal         Stomach:                Appears normal, left
                                                                       sided
 Posterior Fossa:       Appears normal         Abdomen:                Appears normal
 Nuchal Fold:           Appears normal         Abdominal Wall:         Appears nml (cord
                                                                       insert, abd wall)
 Face:                  Appears normal         Cord Vessels:           Appears normal (3
                        (orbits and profile)                           vessel cord)
 Lips:                  Appears normal         Kidneys:                Appear normal
 Palate:                Not well visualized    Bladder:                Appears normal
 Thoracic:              Appears normal         Spine:                  Appears normal
 Heart:                 Not well visualized    Upper Extremities:      Appears normal
 RVOT:                  Not well visualized    Lower Extremities:      Appears normal

 Other:  Fetus appears to be female. Heels visualized. Nasal bone visualized.
         Technically difficult due to fetal position.
Cervix Uterus Adnexa

 Cervix
 Length:           3.68  cm.
 Normal appearance by transabdominal scan.

 Uterus
 No abnormality visualized.

 Right Ovary
 Not visualized.

 Left Ovary
 Within normal limits.
Comments

 This patient was seen for a detailed fetal anatomy scan.
 She denies any significant past medical history and denies
 any problems in her current pregnancy.
 She had a cell free DNA test earlier in her pregnancy which
 indicated a low risk for trisomy 21, 18, and 13. A female fetus
 is predicted.
 She was informed that the fetal growth and amniotic fluid
 level were appropriate for her gestational age.
 There were no obvious fetal anomalies noted on today's
 ultrasound exam.  However, the views of the fetal anatomy
 were limited today due to the fetal position.
 The patient was informed that anomalies may be missed due
 to technical limitations. If the fetus is in a suboptimal position
 or maternal habitus is increased, visualization of the fetus in
 the maternal uterus may be impaired.
 A low-lying placenta measuring 1.1 cm away from the internal
 cervical os was noted today.  The patient was reassured that
 the low-lying placenta will most likely resolve and move away
 from the lower uterine segment later in her pregnancy.
 A follow-up exam was scheduled in 4 weeks to complete the
 views of the fetal anatomy and to assess the placental
 location.

## 2021-11-09 DIAGNOSIS — Z3202 Encounter for pregnancy test, result negative: Secondary | ICD-10-CM | POA: Diagnosis not present

## 2021-11-09 DIAGNOSIS — Z0000 Encounter for general adult medical examination without abnormal findings: Secondary | ICD-10-CM | POA: Diagnosis not present

## 2021-11-09 DIAGNOSIS — Z124 Encounter for screening for malignant neoplasm of cervix: Secondary | ICD-10-CM | POA: Diagnosis not present

## 2021-11-09 DIAGNOSIS — Z113 Encounter for screening for infections with a predominantly sexual mode of transmission: Secondary | ICD-10-CM | POA: Diagnosis not present

## 2021-12-02 ENCOUNTER — Other Ambulatory Visit: Payer: Self-pay

## 2021-12-02 ENCOUNTER — Encounter (HOSPITAL_BASED_OUTPATIENT_CLINIC_OR_DEPARTMENT_OTHER): Payer: Self-pay

## 2021-12-02 ENCOUNTER — Emergency Department (HOSPITAL_BASED_OUTPATIENT_CLINIC_OR_DEPARTMENT_OTHER)
Admission: EM | Admit: 2021-12-02 | Discharge: 2021-12-02 | Disposition: A | Discharge status: Home / Self Care | Payer: Medicaid Other | Attending: Emergency Medicine | Admitting: Emergency Medicine

## 2021-12-02 DIAGNOSIS — G4489 Other headache syndrome: Secondary | ICD-10-CM | POA: Insufficient documentation

## 2021-12-02 DIAGNOSIS — R42 Dizziness and giddiness: Secondary | ICD-10-CM | POA: Insufficient documentation

## 2021-12-02 DIAGNOSIS — R519 Headache, unspecified: Secondary | ICD-10-CM | POA: Diagnosis present

## 2021-12-02 DIAGNOSIS — R197 Diarrhea, unspecified: Secondary | ICD-10-CM | POA: Diagnosis not present

## 2021-12-02 MED ORDER — MECLIZINE HCL 25 MG PO TABS: 25.0000 mg | ORAL_TABLET | Freq: Three times a day (TID) | ORAL | 0 refills | Status: DC | PRN

## 2021-12-02 NOTE — Discharge Instructions (Addendum)

## 2021-12-02 NOTE — ED Notes (Signed)
Pt A&OX4 ambulatory at d/c with independent steady gait, NAD. Pt verbalized understanding of d/c instructions, prescription and follow up care. 

## 2021-12-02 NOTE — ED Provider Notes (Signed)
Addendum: Patient reports she had a negative pregnancy test at home She has no abdominal pain or vaginal symptoms   Ripley Fraise, MD 12/02/21 [phone]

## 2021-12-02 NOTE — ED Provider Notes (Signed)
[facility] Provider Note   CSN: [id number] Arrival date & time: 12/02/21  0041     History  Chief Complaint  Patient presents with   Headache   Dizziness    Sarah [name] is a 26 y.o. female.  The history is provided by the patient.  Dizziness Quality:  Vertigo Severity:  Moderate Onset quality:  Gradual Duration:  4 days Timing:  Intermittent Progression:  Waxing and waning Chronicity:  New Relieved by:  Being still Exacerbated by: position change. Associated symptoms: diarrhea, headaches, nausea and vomiting   Associated symptoms: no blood in stool, no chest pain, no palpitations, no shortness of breath, no syncope, no tinnitus, no vision changes and no weakness   Associated symptoms comment:  "Muffled" hearing Patient reports approximately 4 days ago she began having mild headache, loose/frequent stool and feeling that her balance was off.  Since that time the symptoms have been waxing and waning.  She had an episode of vomiting, and multiple episodes of loose stool.  No fever.  No cough.  No chest pain or shortness of breath No abdominal pain. She reports the headache is very mild.  No visual changes.  No focal weakness She reports with position changes and feels that her balance is off     Home Medications Prior to Admission medications   Medication Sig Start Date End Date Taking? Authorizing Provider  meclizine (ANTIVERT) 25 MG tablet Take 1 tablet (25 mg total) by mouth 3 (three) times daily as needed for dizziness. 12/02/21  Yes Ripley Fraise, MD  acetaminophen (TYLENOL) 325 MG tablet Take 2 tablets (650 mg total) by mouth every 4 (four) hours as needed (for pain scale < 4). 05/27/21   Rowland Lathe, MD  albuterol (PROVENTIL HFA;VENTOLIN HFA) 108 (90 Base) MCG/ACT inhaler Inhale into the lungs every 6 (six) hours as needed for wheezing or shortness of breath.    [provider]  fluticasone (FLOVENT HFA) 110  MCG/ACT inhaler Inhale 2 puffs into the lungs 2 (two) times daily.    [provider]  Prenatal Vit-Fe Fumarate-FA (PRENATAL MULTIVITAMIN) TABS tablet Take 1 tablet by mouth daily at 12 noon.    [provider]      Allergies    Patient has no known allergies.    Review of Systems   Review of Systems  Constitutional:  Negative for fever.  HENT:  Negative for tinnitus.   Eyes:  Negative for visual disturbance.  Respiratory:  Negative for shortness of breath.   Cardiovascular:  Negative for chest pain, palpitations and syncope.  Gastrointestinal:  Positive for diarrhea, nausea and vomiting. Negative for abdominal pain and blood in stool.  Genitourinary:  Negative for dysuria.  Neurological:  Positive for headaches. Negative for syncope and weakness.  All other systems reviewed and are negative.  Physical Exam Updated Vital Signs BP 129/83 (BP Location: Left Arm)    Pulse 83    Temp 98.2 F (36.8 C) (Oral)    Resp 18    Ht 1.575 m (5\' 2" )    Wt 68 kg    LMP 11/10/2021 (Exact Date)    SpO2 100%    BMI 27.44 kg/m  Physical Exam CONSTITUTIONAL: Well developed/well nourished HEAD: Normocephalic/atraumatic EYES: EOMI/PERRL, no nystagmus, no ptosis,  ENMT: Mucous membranes moist NECK: supple no meningeal signs, no bruits SPINE/BACK:entire spine nontender CV: S1/S2 noted, no murmurs/rubs/gallops noted LUNGS: Lungs are clear to auscultation bilaterally, no apparent distress ABDOMEN: soft, nontender, no  rebound or guarding GU:no cva tenderness NEURO:Awake/alert, face symmetric, no arm or leg drift is noted Equal 5/5 strength with shoulder abduction, elbow flex/extension, wrist flex/extension in upper extremities and equal hand grips bilaterally Equal 5/5 strength with hip flexion,knee flex/extension, foot dorsi/plantar flexion Cranial nerves 3/4/5/6/04/25/09/11/12 tested and intact Gait normal without ataxia No past pointing Sensation to light touch intact in all  extremities EXTREMITIES: pulses normal, full ROM SKIN: warm, color normal PSYCH: no abnormalities of mood noted, alert and oriented to situation  ED Results / Procedures / Treatments   Labs (all labs ordered are listed, but only abnormal results are displayed) Labs Reviewed - No data to display  EKG EKG Interpretation  Date/Time:  Wednesday December 02 2021 01:30:13 EST Ventricular Rate:  62 PR Interval:  187 QRS Duration: 80 QT Interval:  397 QTC Calculation: 404 R Axis:   84 Text Interpretation: Sinus rhythm No previous ECGs available Confirmed by Ripley Fraise [phone]) on 12/02/2021 1:33:29 AM  Radiology No results found.  Procedures Procedures    Medications Ordered in ED Medications - No data to display  ED Course/ Medical Decision Making/ A&P                           Medical Decision Making Amount and/or Complexity of Data Reviewed ECG/medicine tests: ordered.   This patient presents to the ED for concern of headache, dizziness, vertigo, vomiting diarrhea this involves an extensive number of treatment options, and is a complaint that carries with it a high risk of complications and morbidity.  The differential diagnosis includes viral illness, gastroenteritis, vertigo, stroke, intracranial hemorrhage, subarachnoid hemorrhage  Additional history obtained:  Records reviewed previous admission documents   Cardiac Monitoring: The patient was maintained on a cardiac monitor.  I personally viewed and interpreted the cardiac monitor which showed an underlying rhythm of:  sinus rhythm  Test Considered: Considered labs and CT head, but patient is improving and is in no acute distress   Reevaluation: After the interventions noted above, I reevaluated the patient and found that they have :stayed the same  Complexity of problems addressed: Patients presentation is most consistent with  acute illness/injury with systemic symptoms  Disposition: After  consideration of the diagnostic results and the patients response to treatment,  I feel that the patent would benefit from discharge   .    Patient presented multiple complaints including headache, dizziness/vertigo and isolated vomiting and diarrhea. She is in no acute distress.  She is well-appearing.  She reports her headache is mild. Possible she had a viral illness that has triggered peripheral vertigo.  No signs of acute stroke. No signs of acute neurologic emergency She declines further work-up at this time.  Will prescribe Antivert as needed for symptoms. We discussed strict return precautions       Final Clinical Impression(s) / ED Diagnoses Final diagnoses:  Vertigo  Other headache syndrome    Rx / DC Orders ED Discharge Orders          Ordered    meclizine (ANTIVERT) 25 MG tablet  3 times daily PRN        12/02/21 0128              Ripley Fraise, MD 12/02/21 0140

## 2021-12-02 NOTE — ED Triage Notes (Signed)
Patient complains of dizziness and headache which began Saturday.  Patient had 1 episode of vomiting on Sunday and has had loose stools.

## 2021-12-03 ENCOUNTER — Telehealth: Payer: Self-pay

## 2021-12-03 DIAGNOSIS — Z9189 Other specified personal risk factors, not elsewhere classified: Secondary | ICD-10-CM

## 2021-12-03 NOTE — Telephone Encounter (Signed)
Transition Care Management Follow-up Telephone Call Date of discharge and from where: 12/02/2021-[facility] How have you been since you were released from the hospital? Pt stated she is doing fine.  Any questions or concerns? No  Items Reviewed: Did the pt receive and understand the discharge instructions provided? Yes  Medications obtained and verified? Yes  Other? No  Any new allergies since your discharge? No  Dietary orders reviewed? No Do you have support at home? Yes   Home Care and Equipment/Supplies: Were home health services ordered? not applicable If so, what is the name of the agency? N/A  Has the agency set up a time to come to the patient's home? not applicable Were any new equipment or medical supplies ordered?  No What is the name of the medical supply agency? N/A Were you able to get the supplies/equipment? not applicable Do you have any questions related to the use of the equipment or supplies? No  Functional Questionnaire: (I = Independent and D = Dependent) ADLs: I  Bathing/Dressing- I  Meal Prep- I  Eating- I  Maintaining continence- I  Transferring/Ambulation- I  Managing Meds- I  Follow up appointments reviewed:  PCP Hospital f/u appt confirmed? No   Specialist Hospital f/u appt confirmed? No   Are transportation arrangements needed? No  If their condition worsens, is the pt aware to call PCP or go to the Emergency Dept.? Yes Was the patient provided with contact information for the PCP's office or ED? Yes Was to pt encouraged to call back with questions or concerns? Yes

## 2021-12-03 NOTE — Telephone Encounter (Signed)
° °  Telephone encounter was:  Successful.  12/03/2021 Name: Sarah [name] MRN: [id number] DOB: [birth year]  Sarah [name] is a 26 y.o. year old female who is a primary care patient of Page, Wandra Arthurs, PA-C . The community resource team was consulted for assistance with  pcp  Care guide performed the following interventions: Patient provided with information about care guide support team and interviewed to confirm resource needs.Gave pcp information over the phone  Follow Up Plan:  No further follow up planned at this time. The patient has been provided with needed resources.    Lenard Forth Care Guide, Embedded Care Coordination [facility], Care Management  [phone] [address] [place], [place], [place] [address] Phone: [phone] Email: [email or web address]

## 2022-07-05 DIAGNOSIS — Z13220 Encounter for screening for lipoid disorders: Secondary | ICD-10-CM | POA: Diagnosis not present

## 2022-07-05 DIAGNOSIS — Z23 Encounter for immunization: Secondary | ICD-10-CM | POA: Diagnosis not present

## 2022-07-05 DIAGNOSIS — Z833 Family history of diabetes mellitus: Secondary | ICD-10-CM | POA: Diagnosis not present

## 2022-07-05 DIAGNOSIS — F411 Generalized anxiety disorder: Secondary | ICD-10-CM | POA: Diagnosis not present

## 2022-09-02 DIAGNOSIS — J4520 Mild intermittent asthma, uncomplicated: Secondary | ICD-10-CM | POA: Diagnosis not present

## 2022-09-02 DIAGNOSIS — B9689 Other specified bacterial agents as the cause of diseases classified elsewhere: Secondary | ICD-10-CM | POA: Diagnosis not present

## 2022-09-02 DIAGNOSIS — R051 Acute cough: Secondary | ICD-10-CM | POA: Diagnosis not present

## 2022-09-02 DIAGNOSIS — J019 Acute sinusitis, unspecified: Secondary | ICD-10-CM | POA: Diagnosis not present

## 2022-10-04 DIAGNOSIS — Z124 Encounter for screening for malignant neoplasm of cervix: Secondary | ICD-10-CM | POA: Diagnosis not present

## 2022-10-04 DIAGNOSIS — Z0000 Encounter for general adult medical examination without abnormal findings: Secondary | ICD-10-CM | POA: Diagnosis not present

## 2022-10-04 DIAGNOSIS — F411 Generalized anxiety disorder: Secondary | ICD-10-CM | POA: Diagnosis not present

## 2022-10-04 DIAGNOSIS — R87610 Atypical squamous cells of undetermined significance on cytologic smear of cervix (ASC-US): Secondary | ICD-10-CM | POA: Diagnosis not present

## 2022-10-15 DIAGNOSIS — F411 Generalized anxiety disorder: Secondary | ICD-10-CM | POA: Diagnosis not present

## 2022-11-28 DIAGNOSIS — Z0279 Encounter for issue of other medical certificate: Secondary | ICD-10-CM | POA: Diagnosis not present

## 2022-12-01 DIAGNOSIS — Z0000 Encounter for general adult medical examination without abnormal findings: Secondary | ICD-10-CM | POA: Diagnosis not present

## 2022-12-16 DIAGNOSIS — F411 Generalized anxiety disorder: Secondary | ICD-10-CM | POA: Diagnosis not present

## 2023-01-13 DIAGNOSIS — J4520 Mild intermittent asthma, uncomplicated: Secondary | ICD-10-CM | POA: Diagnosis not present

## 2023-01-13 DIAGNOSIS — F411 Generalized anxiety disorder: Secondary | ICD-10-CM | POA: Diagnosis not present

## 2023-01-13 DIAGNOSIS — L918 Other hypertrophic disorders of the skin: Secondary | ICD-10-CM | POA: Diagnosis not present

## 2023-01-27 DIAGNOSIS — L905 Scar conditions and fibrosis of skin: Secondary | ICD-10-CM | POA: Diagnosis not present

## 2023-01-27 DIAGNOSIS — L83 Acanthosis nigricans: Secondary | ICD-10-CM | POA: Diagnosis not present

## 2023-01-27 DIAGNOSIS — L918 Other hypertrophic disorders of the skin: Secondary | ICD-10-CM | POA: Diagnosis not present

## 2023-01-27 DIAGNOSIS — L821 Other seborrheic keratosis: Secondary | ICD-10-CM | POA: Diagnosis not present

## 2023-02-18 DIAGNOSIS — L918 Other hypertrophic disorders of the skin: Secondary | ICD-10-CM | POA: Diagnosis not present

## 2023-03-31 DIAGNOSIS — F411 Generalized anxiety disorder: Secondary | ICD-10-CM | POA: Diagnosis not present

## 2023-04-07 DIAGNOSIS — L83 Acanthosis nigricans: Secondary | ICD-10-CM | POA: Diagnosis not present

## 2023-05-22 ENCOUNTER — Encounter (HOSPITAL_COMMUNITY): Payer: Self-pay

## 2023-05-22 ENCOUNTER — Inpatient Hospital Stay (HOSPITAL_COMMUNITY)
Admission: AD | Admit: 2023-05-22 | Discharge: 2023-05-22 | Disposition: A | Discharge status: Home / Self Care | Payer: Medicaid Other | Attending: Obstetrics and Gynecology | Admitting: Obstetrics and Gynecology

## 2023-05-22 DIAGNOSIS — O219 Vomiting of pregnancy, unspecified: Secondary | ICD-10-CM

## 2023-05-22 DIAGNOSIS — Z3A01 Less than 8 weeks gestation of pregnancy: Secondary | ICD-10-CM | POA: Diagnosis not present

## 2023-05-22 LAB — URINALYSIS, ROUTINE W REFLEX MICROSCOPIC
Bilirubin Urine: NEGATIVE
Glucose, UA: NEGATIVE mg/dL
Hgb urine dipstick: NEGATIVE
Ketones, ur: 80 mg/dL — AB
Leukocytes,Ua: NEGATIVE
Nitrite: NEGATIVE
Protein, ur: 30 mg/dL — AB
Specific Gravity, Urine: 1.02 (ref 1.005–1.030)
pH: 5 (ref 5.0–8.0)

## 2023-05-22 MED ORDER — SCOPOLAMINE 1 MG/3DAYS TD PT72
1.0000 | MEDICATED_PATCH | Freq: Once | TRANSDERMAL | Status: DC
  Administered 2023-05-22: 1.5 mg via TRANSDERMAL
  Filled 2023-05-22: qty 1

## 2023-05-22 MED ORDER — ONDANSETRON 4 MG PO TBDP: 4.0000 mg | ORAL_TABLET | Freq: Three times a day (TID) | ORAL | 0 refills | Status: DC | PRN

## 2023-05-22 MED ORDER — FAMOTIDINE IN NACL 20-0.9 MG/50ML-% IV SOLN
20.0000 mg | Freq: Once | INTRAVENOUS | Status: AC
  Administered 2023-05-22: 20 mg via INTRAVENOUS
  Filled 2023-05-22: qty 50

## 2023-05-22 MED ORDER — GLYCOPYRROLATE 0.2 MG/ML IJ SOLN
0.2000 mg | Freq: Once | INTRAMUSCULAR | Status: AC
  Administered 2023-05-22: 0.2 mg via INTRAVENOUS
  Filled 2023-05-22: qty 1

## 2023-05-22 MED ORDER — LACTATED RINGERS IV BOLUS
1000.0000 mL | Freq: Once | INTRAVENOUS | Status: AC
  Administered 2023-05-22: 1000 mL via INTRAVENOUS

## 2023-05-22 MED ORDER — GLYCOPYRROLATE 1 MG PO TABS
1.0000 mg | ORAL_TABLET | Freq: Three times a day (TID) | ORAL | 0 refills | Status: DC
Start: 2023-05-22 — End: 2023-12-18

## 2023-05-22 MED ORDER — ONDANSETRON HCL 4 MG/2ML IJ SOLN
4.0000 mg | Freq: Once | INTRAMUSCULAR | Status: AC
  Administered 2023-05-22: 4 mg via INTRAVENOUS
  Filled 2023-05-22: qty 2

## 2023-05-22 MED ORDER — DICLEGIS 10-10 MG PO TBEC: 1.0000 | DELAYED_RELEASE_TABLET | Freq: Every day | ORAL | 3 refills | Status: DC

## 2023-05-22 MED ORDER — TRANSDERM-SCOP 1 MG/3DAYS TD PT72: 1.0000 | MEDICATED_PATCH | TRANSDERMAL | 0 refills | Status: DC

## 2023-05-22 MED ORDER — POLYETHYLENE GLYCOL 3350 17 GM/SCOOP PO POWD: 1.0000 | Freq: Once | ORAL | 0 refills | Status: AC

## 2023-05-22 NOTE — MAU Note (Signed)
.  Sarah [name] is a 27 y.o. at Unknown here in MAU reporting: N/V ongoing for the last 3 weeks, and unable to keep anything down, more than 10 episodes of emesis. Pt states she has taking promethazine orally, but not effective unable to keep it down  Pt report taking a home preg test on 7/11 that was POS. Pt has not established PNC yet. Pt denies VB, cramping, or LOF.  LMP: 03/31/2023 Onset of complaint: ongoing Pain score: 0/10 Vitals:   05/22/23 1955  BP: 122/80  Pulse: 99  Resp: 18  Temp: 99.3 F (37.4 C)  SpO2: 100%      Lab orders placed from triage:  UPT, UA

## 2023-05-22 NOTE — MAU Provider Note (Signed)
Chief Complaint:  Emesis and Nausea  HPI   Event Date/Time   First Provider Initiated Contact with Patient 05/22/23 2104     Sarah [name] is a 27 y.o. G3P1011 at [redacted]w[redacted]d who presents to maternity admissions reporting constant nausea/vomiting/spitting for the last 3 weeks, worsened over the past 24hrs. Estimates 10 episodes of vomiting, with continuous spitting. Has promethazine at home but cannot keep it down. No other physical complaints.   Pregnancy Course: Planning care at [facility].  Past Medical History:  Diagnosis Date   Asthma    OB History  Gravida Para Term Preterm AB Living  3 1 1   1 1   SAB IAB Ectopic Multiple Live Births  1     0 1    # Outcome Date GA Lbr Len/2nd Weight Sex Type Anes PTL Lv  3 Current           2 Term 05/25/21 [redacted]w[redacted]d 15:40 / 00:35 6 lb 4.7 oz (2.855 kg) F Vag-Spont EPI  LIV  1 SAB            Past Surgical History:  Procedure Laterality Date   TONSILLECTOMY     Family History  Problem Relation Age of Onset   Healthy Mother    Healthy Father    Hypertension Maternal Grandmother    Social History   Tobacco Use   Smoking status: Never    Passive exposure: Never   Smokeless tobacco: Never  Vaping Use   Vaping status: Never Used  Substance Use Topics   Alcohol use: No   Drug use: No   No Known Allergies No medications prior to admission.   I have reviewed patient's Past Medical Hx, Surgical Hx, Family Hx, Social Hx, medications and allergies.   ROS  Pertinent items noted in HPI and remainder of comprehensive ROS otherwise negative.   PHYSICAL EXAM  Patient Vitals for the past 24 hrs:  BP Temp Temp src Pulse Resp SpO2 Height Weight  05/22/23 2326 122/63 -- -- 67 -- -- -- --  05/22/23 1955 122/80 99.3 F (37.4 C) Oral 99 18 100 % 5\' 2"  (1.575 m) 147 lb 11.2 oz (67 kg)   Constitutional: Well-developed, well-nourished female in no acute distress.  Cardiovascular: normal rate & rhythm, warm and well-perfused Respiratory:  normal effort, no problems with respiration noted GI: Abd soft, non-tender, non-distended MS: Extremities nontender, no edema, normal ROM Neurologic: Alert and oriented x 4.  GU: no CVA tenderness Pelvic: exam deferred   Labs: Results for orders placed or performed during the hospital encounter of 05/22/23 (from the past 24 hour(s))  Urinalysis, Routine w reflex microscopic -Urine, Clean Catch     Status: Abnormal   Collection Time: 05/22/23  7:37 PM  Result Value Ref Range   Color, Urine YELLOW YELLOW   APPearance HAZY (A) CLEAR   Specific Gravity, Urine 1.020 1.005 - 1.030   pH 5.0 5.0 - 8.0   Glucose, UA NEGATIVE NEGATIVE mg/dL   Hgb urine dipstick NEGATIVE NEGATIVE   Bilirubin Urine NEGATIVE NEGATIVE   Ketones, ur 80 (A) NEGATIVE mg/dL   Protein, ur 30 (A) NEGATIVE mg/dL   Nitrite NEGATIVE NEGATIVE   Leukocytes,Ua NEGATIVE NEGATIVE   RBC / HPF 0-5 0 - 5 RBC/hpf   WBC, UA 0-5 0 - 5 WBC/hpf   Bacteria, UA RARE (A) NONE SEEN   Squamous Epithelial / HPF 0-5 0 - 5 /HPF   Mucus PRESENT   Pregnancy, urine POC  Status: Abnormal   Collection Time: 05/22/23  7:45 PM  Result Value Ref Range   Preg Test, Ur POSITIVE (A) NEGATIVE   Imaging:  No results found.  MDM & MAU COURSE  MDM: Moderate  MAU Course: Orders Placed This Encounter  Procedures   Urinalysis, Routine w reflex microscopic -Urine, Clean Catch   Pregnancy, urine POC   Discharge patient   Meds ordered this encounter  Medications   lactated ringers bolus 1,000 mL   ondansetron (ZOFRAN) injection 4 mg   famotidine (PEPCID) IVPB 20 mg premix   DISCONTD: scopolamine (TRANSDERM-SCOP) 1 MG/3DAYS 1.5 mg   glycopyrrolate (ROBINUL) injection 0.2 mg   scopolamine (TRANSDERM-SCOP) 1 MG/3DAYS    Sig: Place 1 patch (1.5 mg total) onto the skin every 3 (three) days.    Dispense:  10 patch    Refill:  0   ondansetron (ZOFRAN-ODT) 4 MG disintegrating tablet    Sig: Take 1 tablet (4 mg total) by mouth every 8 (eight)  hours as needed for nausea or vomiting.    Dispense:  15 tablet    Refill:  0   polyethylene glycol powder (MIRALAX) 17 GM/SCOOP powder    Sig: Take 255 g by mouth once for 1 dose.    Dispense:  255 g    Refill:  0   Doxylamine-Pyridoxine (DICLEGIS) 10-10 MG TBEC    Sig: Take 1-2 tablets by mouth at bedtime.    Dispense:  60 tablet    Refill:  3   glycopyrrolate (ROBINUL) 1 MG tablet    Sig: Take 1 tablet (1 mg total) by mouth 3 (three) times daily.    Dispense:  90 tablet    Refill:  0   Pt vomited as soon as she arrived to MAU room and then continued spitting and occasionally heaving. Scopolamine patch applied quickly, then LR bolus with pepcid, zofran and robinul ordered. Meds relieved symptoms and pt able to keep down grape juice and crackers. New meds sent to pharmacy with instructions on how to take them.  ASSESSMENT   1. Nausea and vomiting during pregnancy   2. [redacted] weeks gestation of pregnancy    PLAN  Discharge home in stable condition with return precautions See AVS for additional education on medications   Follow-up Information     Ob/Gyn, [facility] Follow up.   Why: as scheduled for ongoing prenatal care Contact information: [address] [place] [address] [phone]                 Allergies as of 05/22/2023   No Known Allergies      Medication List     TAKE these medications    acetaminophen 325 MG tablet Commonly known as: Tylenol Take 2 tablets (650 mg total) by mouth every 4 (four) hours as needed (for pain scale < 4).   albuterol 108 (90 Base) MCG/ACT inhaler Commonly known as: VENTOLIN HFA Inhale into the lungs every 6 (six) hours as needed for wheezing or shortness of breath.   Diclegis 10-10 MG Tbec Generic drug: Doxylamine-Pyridoxine Take 1-2 tablets by mouth at bedtime.   fluticasone 110 MCG/ACT inhaler Commonly known as: FLOVENT HFA Inhale 2 puffs into the lungs 2 (two) times daily.   glycopyrrolate 1 MG  tablet Commonly known as: Robinul Take 1 tablet (1 mg total) by mouth 3 (three) times daily.   meclizine 25 MG tablet Commonly known as: ANTIVERT Take 1 tablet (25 mg total) by mouth 3 (  three) times daily as needed for dizziness.   ondansetron 4 MG disintegrating tablet Commonly known as: ZOFRAN-ODT Take 1 tablet (4 mg total) by mouth every 8 (eight) hours as needed for nausea or vomiting.   prenatal multivitamin Tabs tablet Take 1 tablet by mouth daily at 12 noon.   Transderm-Scop 1 MG/3DAYS Generic drug: scopolamine Place 1 patch (1.5 mg total) onto the skin every 3 (three) days.       ASK your doctor about these medications    polyethylene glycol powder 17 GM/SCOOP powder Commonly known as: MiraLax Take 255 g by mouth once for 1 dose. Ask about: Should I take this medication?       Edd Arbour, CNM, MSN, IBCLC Certified Nurse Midwife, [facility]

## 2023-05-22 NOTE — Discharge Instructions (Signed)
Start taking diclegis at night (takes a few days for full effect) and robinul every day (for spitting) Change out scopolamine patch every 3 days as needed if diclegis isn't enough Take zofran sparingly as needed for nausea - if using regularly, take miralax in juice daily to prevent constipation  Diclegis = daily nausea prevention medication Robinul = daily spitting prevention Scopolamine = for nausea even with diclegis Zofran = if other meds are not working Miralax = when taking zofran regularly to prevent constipation

## 2023-06-01 DIAGNOSIS — Z3480 Encounter for supervision of other normal pregnancy, unspecified trimester: Secondary | ICD-10-CM | POA: Diagnosis not present

## 2023-06-01 DIAGNOSIS — Z113 Encounter for screening for infections with a predominantly sexual mode of transmission: Secondary | ICD-10-CM | POA: Diagnosis not present

## 2023-06-01 DIAGNOSIS — O26841 Uterine size-date discrepancy, first trimester: Secondary | ICD-10-CM | POA: Diagnosis not present

## 2023-06-01 LAB — OB RESULTS CONSOLE GC/CHLAMYDIA
Chlamydia: NEGATIVE
Neisseria Gonorrhea: NEGATIVE

## 2023-06-02 ENCOUNTER — Other Ambulatory Visit: Payer: Self-pay

## 2023-06-02 ENCOUNTER — Inpatient Hospital Stay (HOSPITAL_COMMUNITY)
Admission: AD | Admit: 2023-06-02 | Discharge: 2023-06-02 | Disposition: A | Discharge status: Home / Self Care | Payer: Medicaid Other | Attending: Obstetrics and Gynecology | Admitting: Obstetrics and Gynecology

## 2023-06-02 DIAGNOSIS — Z3A09 9 weeks gestation of pregnancy: Secondary | ICD-10-CM | POA: Diagnosis not present

## 2023-06-02 DIAGNOSIS — O210 Mild hyperemesis gravidarum: Secondary | ICD-10-CM | POA: Diagnosis not present

## 2023-06-02 DIAGNOSIS — O219 Vomiting of pregnancy, unspecified: Secondary | ICD-10-CM

## 2023-06-02 LAB — URINALYSIS, ROUTINE W REFLEX MICROSCOPIC
Glucose, UA: NEGATIVE mg/dL
Ketones, ur: 20 mg/dL — AB
Nitrite: NEGATIVE
Protein, ur: 100 mg/dL — AB
Specific Gravity, Urine: 1.023 (ref 1.005–1.030)
pH: 5 (ref 5.0–8.0)

## 2023-06-02 MED ORDER — FAMOTIDINE 20 MG PO TABS: 20.0000 mg | ORAL_TABLET | Freq: Every day | ORAL | 3 refills | Status: DC

## 2023-06-02 MED ORDER — ONDANSETRON 4 MG PO TBDP
4.0000 mg | ORAL_TABLET | Freq: Once | ORAL | Status: AC
  Administered 2023-06-02: 4 mg via ORAL
  Filled 2023-06-02: qty 1

## 2023-06-02 MED ORDER — ONDANSETRON 4 MG PO TBDP: 4.0000 mg | ORAL_TABLET | Freq: Three times a day (TID) | ORAL | 0 refills | Status: DC | PRN

## 2023-06-02 MED ORDER — FAMOTIDINE 20 MG PO TABS
20.0000 mg | ORAL_TABLET | Freq: Every day | ORAL | Status: DC
  Administered 2023-06-02: 20 mg via ORAL
  Filled 2023-06-02: qty 1

## 2023-06-02 NOTE — MAU Provider Note (Signed)
History     CSN: [id number]  Arrival date and time: 06/02/23 0709   None     Chief Complaint  Patient presents with   Emesis   Nausea   Patient is presenting for evaluation for nausea and vomiting.  Reports that has been going on for 3 to 4 weeks and she has been prescribed medications.  She reports that she was prescribed Zofran, Phenergan, scopolamine patches.  She has been using the scopolamine patches and rectal Phenergan without relief.  She has been hesitant to use the Zofran because she was told it may increase the risk for cleft lip if taken in the first trimester.  She is open to taking Zofran at this time because she cannot keep anything down but she lost the medications.  Denies any vaginal bleeding, gushing of fluid, contractions.  Denies headache blurry vision.  Does complain of some reflux symptoms as well as the nausea and vomiting.    OB History     Gravida  3   Para  1   Term  1   Preterm      AB  1   Living  1      SAB  1   IAB      Ectopic      Multiple  0   Live Births  1           Past Medical History:  Diagnosis Date   Asthma     Past Surgical History:  Procedure Laterality Date   TONSILLECTOMY      Family History  Problem Relation Age of Onset   Healthy Mother    Healthy Father    Hypertension Maternal Grandmother     Social History   Tobacco Use   Smoking status: Never    Passive exposure: Never   Smokeless tobacco: Never  Vaping Use   Vaping status: Never Used  Substance Use Topics   Alcohol use: No   Drug use: No    Allergies: No Known Allergies  Medications Prior to Admission  Medication Sig Dispense Refill Last Dose   acetaminophen (TYLENOL) 325 MG tablet Take 2 tablets (650 mg total) by mouth every 4 (four) hours as needed (for pain scale < 4).      albuterol (PROVENTIL HFA;VENTOLIN HFA) 108 (90 Base) MCG/ACT inhaler Inhale into the lungs every 6 (six) hours as needed for wheezing or shortness of breath.       Doxylamine-Pyridoxine (DICLEGIS) 10-10 MG TBEC Take 1-2 tablets by mouth at bedtime. 60 tablet 3    fluticasone (FLOVENT HFA) 110 MCG/ACT inhaler Inhale 2 puffs into the lungs 2 (two) times daily.      glycopyrrolate (ROBINUL) 1 MG tablet Take 1 tablet (1 mg total) by mouth 3 (three) times daily. 90 tablet 0    meclizine (ANTIVERT) 25 MG tablet Take 1 tablet (25 mg total) by mouth 3 (three) times daily as needed for dizziness. 15 tablet 0    ondansetron (ZOFRAN-ODT) 4 MG disintegrating tablet Take 1 tablet (4 mg total) by mouth every 8 (eight) hours as needed for nausea or vomiting. 15 tablet 0    Prenatal Vit-Fe Fumarate-FA (PRENATAL MULTIVITAMIN) TABS tablet Take 1 tablet by mouth daily at 12 noon.      scopolamine (TRANSDERM-SCOP) 1 MG/3DAYS Place 1 patch (1.5 mg total) onto the skin every 3 (three) days. 10 patch 0     Review of Systems  Constitutional:  Negative for chills and fever.  HENT:  Negative for congestion and rhinorrhea.   Eyes:  Negative for visual disturbance.  Respiratory:  Negative for shortness of breath.   Cardiovascular:  Negative for chest pain.  Gastrointestinal:  Positive for nausea and vomiting. Negative for constipation and diarrhea.  Genitourinary:  Negative for decreased urine volume, vaginal bleeding, vaginal discharge and vaginal pain.  Neurological:  Negative for headaches.   Physical Exam   Blood pressure 110/76, pulse (!) 101, temperature (!) 97 F (36.1 C), temperature source Axillary, resp. rate 18, height 5\' 2"  (1.575 m), weight 63.7 kg, last menstrual period 03/31/2023, SpO2 98%, unknown if currently breastfeeding.  Physical Exam Vitals reviewed.  Constitutional:      General: She is not in acute distress.    Appearance: Normal appearance. She is normal weight. She is ill-appearing.  HENT:     Head: Normocephalic and atraumatic.     Nose: Nose normal.     Mouth/Throat:     Mouth: Mucous membranes are moist.     Pharynx: No oropharyngeal  exudate or posterior oropharyngeal erythema.  Eyes:     Extraocular Movements: Extraocular movements intact.     Pupils: Pupils are equal, round, and reactive to light.  Cardiovascular:     Rate and Rhythm: Normal rate.     Pulses: Normal pulses.  Pulmonary:     Effort: Pulmonary effort is normal.  Abdominal:     Palpations: Abdomen is soft.     Tenderness: There is no abdominal tenderness.  Skin:    General: Skin is warm.     Capillary Refill: Capillary refill takes less than 2 seconds.  Neurological:     General: No focal deficit present.     Mental Status: She is alert.  Psychiatric:        Mood and Affect: Mood normal.     MAU Course  Procedures  MDM Urinalysis Zofran Pepcid P.o. challenge   Assessment and Plan  Sarah [name] is a 27 yo G3P1011 presenting for nausea and vomiting.  Hyperemesis Patient with nausea and vomiting for 3 to 4 weeks.  Has been on Phenergan and scopolamine with little improvement.  Prescribed Zofran previously but did not take and has lost the prescription.  Urinalysis showing small bilirubin, amber color, 20 of ketones.  Small leukocytes and 100 of protein.  Does not appear to be a clean-catch.  Patient given Zofran and Pepcid with improvement of symptoms.  P.o. challenge was positive.  Prescription sent to patient's pharmacy for ODT Zofran.  Strict return precautions given.  Patient discharged home.  Celedonio Savage 06/02/2023, 8:44 AM

## 2023-06-02 NOTE — MAU Note (Signed)
Sarah [name] is a 27 y.o. at [redacted]w[redacted]d here in MAU reporting: she's had N/V for the past 3-4 weeks, states hasn't been able to keep anything down.  Reports has been previously prescribed meds, but lost them, "I don't know where they are." Denies VB. LMP: NA Onset of complaint: 3-4 weeks Pain score: 0 Vitals:   06/02/23 0747  BP: 110/76  Pulse: (!) 101  Resp: 18  Temp: (!) 97 F (36.1 C)  SpO2: 98%     FHT:NA Lab orders placed from triage:   UA

## 2023-06-23 DIAGNOSIS — Z3490 Encounter for supervision of normal pregnancy, unspecified, unspecified trimester: Secondary | ICD-10-CM | POA: Diagnosis not present

## 2023-06-23 DIAGNOSIS — Z3481 Encounter for supervision of other normal pregnancy, first trimester: Secondary | ICD-10-CM | POA: Diagnosis not present

## 2023-06-23 LAB — HEPATITIS C ANTIBODY: HCV Ab: NEGATIVE

## 2023-06-23 LAB — OB RESULTS CONSOLE RUBELLA ANTIBODY, IGM: Rubella: IMMUNE

## 2023-06-23 LAB — OB RESULTS CONSOLE ABO/RH: RH Type: POSITIVE

## 2023-06-23 LAB — OB RESULTS CONSOLE RPR: RPR: NONREACTIVE

## 2023-06-23 LAB — OB RESULTS CONSOLE HEPATITIS B SURFACE ANTIGEN: Hepatitis B Surface Ag: NEGATIVE

## 2023-06-23 LAB — OB RESULTS CONSOLE HIV ANTIBODY (ROUTINE TESTING): HIV: NONREACTIVE

## 2023-06-30 ENCOUNTER — Inpatient Hospital Stay (HOSPITAL_COMMUNITY)
Admission: AD | Admit: 2023-06-30 | Discharge: 2023-07-01 | Disposition: A | Discharge status: Home / Self Care | Payer: Medicaid Other | Attending: Obstetrics and Gynecology | Admitting: Obstetrics and Gynecology

## 2023-06-30 DIAGNOSIS — Z3A13 13 weeks gestation of pregnancy: Secondary | ICD-10-CM | POA: Insufficient documentation

## 2023-06-30 DIAGNOSIS — K2091 Esophagitis, unspecified with bleeding: Secondary | ICD-10-CM | POA: Insufficient documentation

## 2023-06-30 DIAGNOSIS — O99611 Diseases of the digestive system complicating pregnancy, first trimester: Secondary | ICD-10-CM | POA: Insufficient documentation

## 2023-06-30 DIAGNOSIS — R112 Nausea with vomiting, unspecified: Secondary | ICD-10-CM

## 2023-06-30 DIAGNOSIS — K920 Hematemesis: Secondary | ICD-10-CM | POA: Insufficient documentation

## 2023-06-30 DIAGNOSIS — O99511 Diseases of the respiratory system complicating pregnancy, first trimester: Secondary | ICD-10-CM | POA: Insufficient documentation

## 2023-06-30 DIAGNOSIS — R051 Acute cough: Secondary | ICD-10-CM | POA: Insufficient documentation

## 2023-06-30 DIAGNOSIS — O218 Other vomiting complicating pregnancy: Secondary | ICD-10-CM | POA: Insufficient documentation

## 2023-06-30 DIAGNOSIS — O30001 Twin pregnancy, unspecified number of placenta and unspecified number of amniotic sacs, first trimester: Secondary | ICD-10-CM | POA: Insufficient documentation

## 2023-06-30 DIAGNOSIS — J4520 Mild intermittent asthma, uncomplicated: Secondary | ICD-10-CM | POA: Insufficient documentation

## 2023-06-30 NOTE — MAU Note (Signed)
.  Sarah [name] is a 27 y.o. at [redacted]w[redacted]d here in MAU reporting vomiting tonight and she saw a small amt of blood and it scared her. She has asthma and coughs a lot and thinks maybe she has everything irritated. Vomiting is actually better than earlier in pregnancy. Denies vag bleeding and no pain. Pt spits alot  Onset of complaint: tonight Pain score: 0 Vitals:   06/30/23 2353 06/30/23 2355  BP:  108/61  Pulse: (!) 101   Resp: 17   Temp: 98.6 F (37 C)   SpO2: 100%      FHT:did not listen since she has twins Lab orders placed from triage:  u/a

## 2023-07-01 DIAGNOSIS — R051 Acute cough: Secondary | ICD-10-CM

## 2023-07-01 DIAGNOSIS — R059 Cough, unspecified: Secondary | ICD-10-CM | POA: Diagnosis present

## 2023-07-01 DIAGNOSIS — J4520 Mild intermittent asthma, uncomplicated: Secondary | ICD-10-CM

## 2023-07-01 DIAGNOSIS — R112 Nausea with vomiting, unspecified: Secondary | ICD-10-CM

## 2023-07-01 DIAGNOSIS — K2091 Esophagitis, unspecified with bleeding: Secondary | ICD-10-CM

## 2023-07-01 DIAGNOSIS — O30001 Twin pregnancy, unspecified number of placenta and unspecified number of amniotic sacs, first trimester: Secondary | ICD-10-CM | POA: Diagnosis not present

## 2023-07-01 DIAGNOSIS — O218 Other vomiting complicating pregnancy: Secondary | ICD-10-CM | POA: Diagnosis not present

## 2023-07-01 DIAGNOSIS — O99511 Diseases of the respiratory system complicating pregnancy, first trimester: Secondary | ICD-10-CM | POA: Diagnosis not present

## 2023-07-01 DIAGNOSIS — K920 Hematemesis: Secondary | ICD-10-CM

## 2023-07-01 DIAGNOSIS — Z3A13 13 weeks gestation of pregnancy: Secondary | ICD-10-CM

## 2023-07-01 DIAGNOSIS — J45909 Unspecified asthma, uncomplicated: Secondary | ICD-10-CM | POA: Diagnosis present

## 2023-07-01 DIAGNOSIS — N939 Abnormal uterine and vaginal bleeding, unspecified: Secondary | ICD-10-CM | POA: Diagnosis present

## 2023-07-01 DIAGNOSIS — O99611 Diseases of the digestive system complicating pregnancy, first trimester: Secondary | ICD-10-CM | POA: Diagnosis not present

## 2023-07-01 LAB — URINALYSIS, ROUTINE W REFLEX MICROSCOPIC
Bilirubin Urine: NEGATIVE
Glucose, UA: NEGATIVE mg/dL
Hgb urine dipstick: NEGATIVE
Ketones, ur: 5 mg/dL — AB
Leukocytes,Ua: NEGATIVE
Nitrite: NEGATIVE
Protein, ur: NEGATIVE mg/dL
Specific Gravity, Urine: 1.014 (ref 1.005–1.030)
pH: 6 (ref 5.0–8.0)

## 2023-07-01 MED ORDER — ALUM & MAG HYDROXIDE-SIMETH 200-200-20 MG/5ML PO SUSP
30.0000 mL | Freq: Once | ORAL | Status: AC
  Administered 2023-07-01: 30 mL via ORAL
  Filled 2023-07-01: qty 30

## 2023-07-01 MED ORDER — FLUTICASONE PROPIONATE HFA 44 MCG/ACT IN AERO: 1.0000 | INHALATION_SPRAY | Freq: Every day | RESPIRATORY_TRACT | 0 refills | Status: DC

## 2023-07-01 NOTE — Progress Notes (Signed)
Written and verbal d/c instructions given and understanding voiced. Pt states Mylanta helped a lot and will probably start taking her Pepcid as prescribed

## 2023-07-01 NOTE — MAU Provider Note (Signed)
Chief Complaint: Emesis During Pregnancy and Asthma   Event Date/Time   First Provider Initiated Contact with Patient 07/01/23 0007        SUBJECTIVE HPI: Sarah [name] is a 27 y.o. G3P1011 at [redacted]w[redacted]d by LMP who presents to maternity admissions reporting recurrent nausea and vomiting.  Tonight her vomitus had some red blood in it.  Has never seen this before.  Has multiple meds at home for antiemetics and also has Pepcid but does not take any of them.  States does not like to take meds. Has been coughing at night and sometimes coughing stimulates vomiting.  Ran out of her Flovent and her primary MD won't prescribe until she comes for a visit. . She denies vaginal bleeding, h/a, dizziness, or fever/chills.     Emesis  This is a recurrent problem. There has been no fever. Associated symptoms include coughing. Pertinent negatives include no abdominal pain, chest pain, chills, diarrhea, dizziness, fever or myalgias. She has tried nothing for the symptoms.  Asthma She complains of cough. There is no difficulty breathing, shortness of breath, sputum production or wheezing (none currently). This is a chronic problem. The cough is non-productive. Associated symptoms include heartburn. Pertinent negatives include no chest pain, dyspnea on exertion, fever or myalgias. Her past medical history is significant for asthma.   RN Note: Sarah [name] is a 27 y.o. at [redacted]w[redacted]d here in MAU reporting vomiting tonight and she saw a small amt of blood and it scared her. She has asthma and coughs a lot and thinks maybe she has everything irritated. Vomiting is actually better than earlier in pregnancy. Denies vag bleeding and no pain. Pt spits alot  Onset of complaint: tonight Pain score: 0  Past Medical History:  Diagnosis Date   Asthma    Past Surgical History:  Procedure Laterality Date   TONSILLECTOMY     Social History   Socioeconomic History   Marital status: Single    Spouse name: Not on file    Number of children: Not on file   Years of education: Not on file   Highest education level: Not on file  Occupational History   Not on file  Tobacco Use   Smoking status: Never    Passive exposure: Never   Smokeless tobacco: Never  Vaping Use   Vaping status: Never Used  Substance and Sexual Activity   Alcohol use: No   Drug use: No   Sexual activity: Yes    Birth control/protection: None  Other Topics Concern   Not on file  Social History Narrative   Not on file   Social Determinants of Health   Financial Resource Strain: Low Risk  (01/13/2023)   Received from [facility]   Overall Financial Resource Strain (CARDIA)    Difficulty of Paying Living Expenses: Not hard at all  Food Insecurity: No Food Insecurity (01/13/2023)   Received from [facility]   Hunger Vital Sign    Worried About Running Out of Food in the Last Year: Never true    Ran Out of Food in the Last Year: Never true  Transportation Needs: No Transportation Needs (01/13/2023)   Received from [facility]    Lack of Transportation (Medical): No    Lack of Transportation (Non-Medical): No  Physical Activity: Not on file  Stress: Not on file  Social Connections: Unknown (03/30/2022)   Received from [facility]   Social Network    Social Network: Not on  file  Intimate Partner Violence: Unknown (03/30/2022)   Received from [facility]   HITS    Physically Hurt: Not on file    Insult or Talk Down To: Not on file    Threaten Physical Harm: Not on file    Scream or Curse: Not on file   No current facility-administered medications on file prior to encounter.   Current Outpatient Medications on File Prior to Encounter  Medication Sig Dispense Refill   albuterol (PROVENTIL HFA;VENTOLIN HFA) 108 (90 Base) MCG/ACT inhaler Inhale into the lungs every 6 (six) hours as needed for wheezing or shortness of breath.     Prenatal Vit-Fe Fumarate-FA (PRENATAL MULTIVITAMIN) TABS tablet  Take 1 tablet by mouth daily at 12 noon.     scopolamine (TRANSDERM-SCOP) 1 MG/3DAYS Place 1 patch (1.5 mg total) onto the skin every 3 (three) days. 10 patch 0   acetaminophen (TYLENOL) 325 MG tablet Take 2 tablets (650 mg total) by mouth every 4 (four) hours as needed (for pain scale < 4).     Doxylamine-Pyridoxine (DICLEGIS) 10-10 MG TBEC Take 1-2 tablets by mouth at bedtime. 60 tablet 3   famotidine (PEPCID) 20 MG tablet Take 1 tablet (20 mg total) by mouth daily. 30 tablet 3   fluticasone (FLOVENT HFA) 110 MCG/ACT inhaler Inhale 2 puffs into the lungs 2 (two) times daily.     glycopyrrolate (ROBINUL) 1 MG tablet Take 1 tablet (1 mg total) by mouth 3 (three) times daily. (Patient not taking: Reported on 07/01/2023) 90 tablet 0   meclizine (ANTIVERT) 25 MG tablet Take 1 tablet (25 mg total) by mouth 3 (three) times daily as needed for dizziness. 15 tablet 0   ondansetron (ZOFRAN-ODT) 4 MG disintegrating tablet Take 1 tablet (4 mg total) by mouth every 8 (eight) hours as needed for nausea or vomiting. (Patient not taking: Reported on 07/01/2023) 15 tablet 0   No Known Allergies  I have reviewed patient's Past Medical Hx, Surgical Hx, Family Hx, Social Hx, medications and allergies.   ROS:  Review of Systems  Constitutional:  Negative for chills and fever.  Respiratory:  Positive for cough. Negative for sputum production, shortness of breath and wheezing (none currently).   Cardiovascular:  Negative for chest pain and dyspnea on exertion.  Gastrointestinal:  Positive for heartburn and vomiting. Negative for abdominal pain and diarrhea.  Musculoskeletal:  Negative for myalgias.  Neurological:  Negative for dizziness.   Review of Systems  Other systems negative   Physical Exam  Physical Exam Patient Vitals for the past 24 hrs:  BP Temp Pulse Resp SpO2 Height Weight  06/30/23 2355 108/61 -- -- -- -- -- --  06/30/23 2353 -- 98.6 F (37 C) (!) 101 17 100 % 5\' 2"  (1.575 m) 65.8 kg    Constitutional: Well-developed, well-nourished female in no acute distress.  Cardiovascular: normal rate Respiratory: normal effort, Lungs clear bilaterally, no wheezing.  GI: Abd soft, non-tender.  MS: Extremities nontender, no edema, normal ROM Neurologic: Alert and oriented x 4.  GU: Neg CVAT.  LAB RESULTS Results for orders placed or performed during the hospital encounter of 06/30/23 (from the past 24 hour(s))  Urinalysis, Routine w reflex microscopic -Urine, Clean Catch     Status: Abnormal   Collection Time: 07/01/23 12:18 AM  Result Value Ref Range   Color, Urine YELLOW YELLOW   APPearance HAZY (A) CLEAR   Specific Gravity, Urine 1.014 1.005 - 1.030   pH 6.0 5.0 - 8.0   Glucose,  UA NEGATIVE NEGATIVE mg/dL   Hgb urine dipstick NEGATIVE NEGATIVE   Bilirubin Urine NEGATIVE NEGATIVE   Ketones, ur 5 (A) NEGATIVE mg/dL   Protein, ur NEGATIVE NEGATIVE mg/dL   Nitrite NEGATIVE NEGATIVE   Leukocytes,Ua NEGATIVE NEGATIVE        IMAGING No results found.  MAU Management/MDM: I have reviewed the triage vital signs and the nursing notes.   Pertinent labs & imaging results that were available during my care of the patient were reviewed by me and considered in my medical decision making (see chart for details).      I have reviewed her medical records including past results, notes and treatments. Medical, Surgical, and family history were reviewed.  Medications and recent lab tests were reviewed  Discussed options for treating nausea, vomiting, and acid reflux/esophagitis.  Does not want to take meds but may consider. Discussed benefit outweight risks, esp now that she is in second trimester Discussed usual treatment of esophagitis is PPI so it can heal  Encouraged her to use her Pepcid dailly, preferably BID Will Rx Flovent to keep asthma in check May need GI consult if bleeding becomes recurrent  ASSESSMENT Twin IUP at [redacted]w[redacted]d Nausea and vomiting Hematemesis, likely due to  esophagitis Asthma, mild intermittent  PLAN Discharge home Recommend using antiemetics as needed Recommend using Pepcid for acid reduction Mylanta/Tums PRN If bleeding becomes recurrent, refer to GI per OB provider Rx Flovent for asthma  Pt stable at time of discharge. Encouraged to return here if she develops worsening of symptoms, increase in pain, fever, or other concerning symptoms.    Wynelle Bourgeois CNM, MSN Certified Nurse-Midwife 07/01/2023  12:07 AM

## 2023-07-18 ENCOUNTER — Other Ambulatory Visit: Payer: Self-pay | Admitting: Obstetrics and Gynecology

## 2023-07-18 DIAGNOSIS — O30042 Twin pregnancy, dichorionic/diamniotic, second trimester: Secondary | ICD-10-CM

## 2023-07-18 DIAGNOSIS — Z369 Encounter for antenatal screening, unspecified: Secondary | ICD-10-CM | POA: Diagnosis not present

## 2023-07-18 DIAGNOSIS — Z3482 Encounter for supervision of other normal pregnancy, second trimester: Secondary | ICD-10-CM | POA: Diagnosis not present

## 2023-08-05 DIAGNOSIS — D573 Sickle-cell trait: Secondary | ICD-10-CM | POA: Insufficient documentation

## 2023-08-05 DIAGNOSIS — O30049 Twin pregnancy, dichorionic/diamniotic, unspecified trimester: Secondary | ICD-10-CM | POA: Insufficient documentation

## 2023-08-18 ENCOUNTER — Other Ambulatory Visit: Payer: Self-pay | Admitting: *Deleted

## 2023-08-18 ENCOUNTER — Ambulatory Visit: Payer: Medicaid Other | Admitting: *Deleted

## 2023-08-18 ENCOUNTER — Encounter: Payer: Self-pay | Admitting: *Deleted

## 2023-08-18 ENCOUNTER — Other Ambulatory Visit: Payer: Self-pay

## 2023-08-18 ENCOUNTER — Ambulatory Visit: Payer: Medicaid Other | Attending: Obstetrics and Gynecology

## 2023-08-18 VITALS — BP 109/57 | HR 92

## 2023-08-18 DIAGNOSIS — Z148 Genetic carrier of other disease: Secondary | ICD-10-CM | POA: Diagnosis not present

## 2023-08-18 DIAGNOSIS — D573 Sickle-cell trait: Secondary | ICD-10-CM | POA: Diagnosis not present

## 2023-08-18 DIAGNOSIS — O99019 Anemia complicating pregnancy, unspecified trimester: Secondary | ICD-10-CM | POA: Insufficient documentation

## 2023-08-18 DIAGNOSIS — O30049 Twin pregnancy, dichorionic/diamniotic, unspecified trimester: Secondary | ICD-10-CM | POA: Insufficient documentation

## 2023-08-18 DIAGNOSIS — O30042 Twin pregnancy, dichorionic/diamniotic, second trimester: Secondary | ICD-10-CM

## 2023-08-18 DIAGNOSIS — O99012 Anemia complicating pregnancy, second trimester: Secondary | ICD-10-CM | POA: Diagnosis not present

## 2023-08-18 DIAGNOSIS — Z3A20 20 weeks gestation of pregnancy: Secondary | ICD-10-CM | POA: Diagnosis not present

## 2023-09-14 DIAGNOSIS — D573 Sickle-cell trait: Secondary | ICD-10-CM | POA: Diagnosis not present

## 2023-09-19 ENCOUNTER — Ambulatory Visit: Payer: Medicaid Other | Attending: Maternal & Fetal Medicine | Admitting: *Deleted

## 2023-09-19 ENCOUNTER — Encounter: Payer: Self-pay | Admitting: *Deleted

## 2023-09-19 ENCOUNTER — Ambulatory Visit: Payer: Medicaid Other

## 2023-09-19 ENCOUNTER — Other Ambulatory Visit: Payer: Self-pay

## 2023-09-19 DIAGNOSIS — Z3A24 24 weeks gestation of pregnancy: Secondary | ICD-10-CM | POA: Diagnosis not present

## 2023-09-19 DIAGNOSIS — Z148 Genetic carrier of other disease: Secondary | ICD-10-CM | POA: Diagnosis not present

## 2023-09-19 DIAGNOSIS — O99012 Anemia complicating pregnancy, second trimester: Secondary | ICD-10-CM

## 2023-09-19 DIAGNOSIS — O30042 Twin pregnancy, dichorionic/diamniotic, second trimester: Secondary | ICD-10-CM | POA: Diagnosis not present

## 2023-09-19 DIAGNOSIS — D573 Sickle-cell trait: Secondary | ICD-10-CM

## 2023-09-19 DIAGNOSIS — Z3A25 25 weeks gestation of pregnancy: Secondary | ICD-10-CM | POA: Insufficient documentation

## 2023-09-19 DIAGNOSIS — O285 Abnormal chromosomal and genetic finding on antenatal screening of mother: Secondary | ICD-10-CM | POA: Diagnosis not present

## 2023-09-20 ENCOUNTER — Other Ambulatory Visit: Payer: Self-pay | Admitting: *Deleted

## 2023-09-20 DIAGNOSIS — O30042 Twin pregnancy, dichorionic/diamniotic, second trimester: Secondary | ICD-10-CM

## 2023-10-05 DIAGNOSIS — Z3480 Encounter for supervision of other normal pregnancy, unspecified trimester: Secondary | ICD-10-CM | POA: Diagnosis not present

## 2023-10-06 DIAGNOSIS — O30049 Twin pregnancy, dichorionic/diamniotic, unspecified trimester: Secondary | ICD-10-CM | POA: Diagnosis not present

## 2023-10-06 DIAGNOSIS — J4520 Mild intermittent asthma, uncomplicated: Secondary | ICD-10-CM | POA: Diagnosis not present

## 2023-10-06 DIAGNOSIS — O99019 Anemia complicating pregnancy, unspecified trimester: Secondary | ICD-10-CM | POA: Diagnosis not present

## 2023-10-06 DIAGNOSIS — Z0000 Encounter for general adult medical examination without abnormal findings: Secondary | ICD-10-CM | POA: Diagnosis not present

## 2023-10-06 DIAGNOSIS — D573 Sickle-cell trait: Secondary | ICD-10-CM | POA: Diagnosis not present

## 2023-10-06 DIAGNOSIS — J302 Other seasonal allergic rhinitis: Secondary | ICD-10-CM | POA: Diagnosis not present

## 2023-10-20 ENCOUNTER — Ambulatory Visit: Payer: Medicaid Other

## 2023-10-21 ENCOUNTER — Ambulatory Visit: Payer: Medicaid Other

## 2023-11-03 ENCOUNTER — Ambulatory Visit: Payer: Medicaid Other

## 2023-11-14 ENCOUNTER — Other Ambulatory Visit: Payer: Self-pay | Admitting: *Deleted

## 2023-11-14 ENCOUNTER — Ambulatory Visit: Payer: Medicaid Other | Attending: Obstetrics | Admitting: Obstetrics

## 2023-11-14 ENCOUNTER — Ambulatory Visit: Payer: Medicaid Other | Attending: Maternal & Fetal Medicine

## 2023-11-14 ENCOUNTER — Ambulatory Visit: Payer: Medicaid Other | Admitting: *Deleted

## 2023-11-14 ENCOUNTER — Other Ambulatory Visit: Payer: Self-pay

## 2023-11-14 ENCOUNTER — Other Ambulatory Visit: Payer: Self-pay | Admitting: Maternal & Fetal Medicine

## 2023-11-14 VITALS — BP 121/58 | HR 102

## 2023-11-14 DIAGNOSIS — O30042 Twin pregnancy, dichorionic/diamniotic, second trimester: Secondary | ICD-10-CM

## 2023-11-14 DIAGNOSIS — O30043 Twin pregnancy, dichorionic/diamniotic, third trimester: Secondary | ICD-10-CM

## 2023-11-14 DIAGNOSIS — O365990 Maternal care for other known or suspected poor fetal growth, unspecified trimester, not applicable or unspecified: Secondary | ICD-10-CM

## 2023-11-14 DIAGNOSIS — O99013 Anemia complicating pregnancy, third trimester: Secondary | ICD-10-CM

## 2023-11-14 DIAGNOSIS — Z364 Encounter for antenatal screening for fetal growth retardation: Secondary | ICD-10-CM

## 2023-11-14 DIAGNOSIS — O365932 Maternal care for other known or suspected poor fetal growth, third trimester, fetus 2: Secondary | ICD-10-CM

## 2023-11-14 DIAGNOSIS — Z148 Genetic carrier of other disease: Secondary | ICD-10-CM

## 2023-11-14 DIAGNOSIS — D573 Sickle-cell trait: Secondary | ICD-10-CM

## 2023-11-14 DIAGNOSIS — Z3A32 32 weeks gestation of pregnancy: Secondary | ICD-10-CM

## 2023-11-14 NOTE — Progress Notes (Signed)
MFM Consult Note  Sarah [name] is currently at 32 weeks and 4 days.  She has been followed due to a dichorionic, diamniotic twin gestation.     She denies any problems since her last exam and reports feeling vigorous fetal movements throughout the day.    Twin A: EFW 4 pounds 4 ounces (29th percentile for her gestational age). Normal amniotic fluid.  Doppler studies of the umbilical arteries showed continued normal forward flow.  There were no signs of absent or reversed end-diastolic flow noted.    Twin B: EFW 3 pounds 12 ounces (7th percentile for her gestational age, indicating IUGR).  Normal amniotic fluid.  Doppler studies of the umbilical arteries showed continued normal forward flow.  There were no signs of absent or reversed end-diastolic flow noted.    A BPP performed today was 8 out of 8 for both twin A and twin B.  Due to IUGR of twin B, we will continue to follow her with weekly fetal testing and umbilical artery Doppler studies.    We will reassess the fetal growth in 3 weeks.    Should IUGR continue to be noted in twin B, delivery will be recommended at between 36 to 37 weeks.    She will return for another BPP and umbilical artery Doppler study in 1 week.    The patient stated that all of her questions were answered today.    A total of 20 minutes was spent counseling and coordinating the care for this patient.  Greater than 50% of the time was spent in direct face-to-face contact.

## 2023-11-21 ENCOUNTER — Ambulatory Visit: Payer: Medicaid Other

## 2023-11-23 DIAGNOSIS — D573 Sickle-cell trait: Secondary | ICD-10-CM | POA: Diagnosis not present

## 2023-11-24 ENCOUNTER — Ambulatory Visit: Payer: Medicaid Other | Admitting: *Deleted

## 2023-11-24 ENCOUNTER — Other Ambulatory Visit: Payer: Self-pay | Admitting: Obstetrics

## 2023-11-24 ENCOUNTER — Ambulatory Visit: Payer: Medicaid Other | Attending: Obstetrics

## 2023-11-24 ENCOUNTER — Other Ambulatory Visit: Payer: Self-pay

## 2023-11-24 VITALS — BP 122/70 | HR 77

## 2023-11-24 DIAGNOSIS — O30043 Twin pregnancy, dichorionic/diamniotic, third trimester: Secondary | ICD-10-CM | POA: Diagnosis not present

## 2023-11-24 DIAGNOSIS — O365990 Maternal care for other known or suspected poor fetal growth, unspecified trimester, not applicable or unspecified: Secondary | ICD-10-CM | POA: Insufficient documentation

## 2023-11-24 DIAGNOSIS — Z3A34 34 weeks gestation of pregnancy: Secondary | ICD-10-CM | POA: Diagnosis not present

## 2023-11-24 DIAGNOSIS — D573 Sickle-cell trait: Secondary | ICD-10-CM

## 2023-11-24 DIAGNOSIS — O30049 Twin pregnancy, dichorionic/diamniotic, unspecified trimester: Secondary | ICD-10-CM

## 2023-11-24 DIAGNOSIS — O99013 Anemia complicating pregnancy, third trimester: Secondary | ICD-10-CM

## 2023-11-24 DIAGNOSIS — O365932 Maternal care for other known or suspected poor fetal growth, third trimester, fetus 2: Secondary | ICD-10-CM | POA: Diagnosis not present

## 2023-11-24 DIAGNOSIS — Z148 Genetic carrier of other disease: Secondary | ICD-10-CM

## 2023-11-24 NOTE — Procedures (Signed)
 Sarah [name] 13-Mar-1996 [redacted]w[redacted]d   Fetus B Non-Stress Test Interpretation for 11/24/23 NST only  Indication: IUGR and didi twins     Uterine Activity Mode: Toco Contraction Frequency (min): none Resting Tone Palpated: Relaxed  Interpretation (Baby B - Fetal Testing) Nonstress Test Interpretation (Baby B): Reactive (140 baseline, 15x15 accels, no decels) Comments (Baby B): Tracing reviewed byDr. Kizzie Pearson R Barrasso Jan 02, 1996 [redacted]w[redacted]d  Fetus A Non-Stress Test Interpretation for 11/24/23  Indication:  didi twins  Fetal Heart Rate A Mode: External Baseline Rate (A): 145 bpm Variability: Moderate Accelerations: 15 x 15 Decelerations: None Multiple birth?: Yes  Uterine Activity Mode: Toco Contraction Frequency (min): none Resting Tone Palpated: Relaxed  Interpretation (Fetal Testing) Nonstress Test Interpretation: Reactive Comments: Tracing reviewed by Dr. Kizzie

## 2023-12-01 ENCOUNTER — Ambulatory Visit: Payer: Medicaid Other

## 2023-12-02 ENCOUNTER — Ambulatory Visit: Payer: Medicaid Other | Attending: Obstetrics | Admitting: *Deleted

## 2023-12-02 ENCOUNTER — Ambulatory Visit: Payer: Medicaid Other

## 2023-12-02 ENCOUNTER — Other Ambulatory Visit: Payer: Self-pay

## 2023-12-02 ENCOUNTER — Ambulatory Visit: Payer: Medicaid Other | Attending: Obstetrics | Admitting: Obstetrics

## 2023-12-02 VITALS — BP 117/60 | HR 76

## 2023-12-02 DIAGNOSIS — O365930 Maternal care for other known or suspected poor fetal growth, third trimester, not applicable or unspecified: Secondary | ICD-10-CM | POA: Diagnosis not present

## 2023-12-02 DIAGNOSIS — Z3A35 35 weeks gestation of pregnancy: Secondary | ICD-10-CM

## 2023-12-02 DIAGNOSIS — O99013 Anemia complicating pregnancy, third trimester: Secondary | ICD-10-CM

## 2023-12-02 DIAGNOSIS — O30043 Twin pregnancy, dichorionic/diamniotic, third trimester: Secondary | ICD-10-CM | POA: Diagnosis not present

## 2023-12-02 DIAGNOSIS — O365990 Maternal care for other known or suspected poor fetal growth, unspecified trimester, not applicable or unspecified: Secondary | ICD-10-CM

## 2023-12-02 DIAGNOSIS — O365932 Maternal care for other known or suspected poor fetal growth, third trimester, fetus 2: Secondary | ICD-10-CM

## 2023-12-02 DIAGNOSIS — O280 Abnormal hematological finding on antenatal screening of mother: Secondary | ICD-10-CM

## 2023-12-02 DIAGNOSIS — O30049 Twin pregnancy, dichorionic/diamniotic, unspecified trimester: Secondary | ICD-10-CM

## 2023-12-02 DIAGNOSIS — D573 Sickle-cell trait: Secondary | ICD-10-CM | POA: Diagnosis not present

## 2023-12-02 NOTE — Progress Notes (Signed)
MFM Consult Note  Sarah [name] is currently at 35 weeks and 0 days.  She has been followed due to a dichorionic, diamniotic twin pregnancy with IUGR of twin B.   She denies any problems since her last exam.  She reports feeling fetal movements of both fetuses throughout the day.  A BPP performed today was 8 out of 8 for both twin A and twin B.  There was normal amniotic fluid noted around both fetuses.  Doppler studies of the umbilical arteries performed due to IUGR showed a normal S/D ratio for both twin A and twin B. There were no signs of absent or reversed end-diastolic flow noted today in either fetus.  We will reassess the fetal growth next week.    Should IUGR continue to be noted, delivery will be recommended at between 36 to 37 weeks (after next week).  She will return in 1 week for another BPP, growth scan, and umbilical artery Doppler study.    The patient stated that all of her questions were answered today.  A total of 20 minutes was spent counseling and coordinating the care for this patient.  Greater than 50% of the time was spent in direct face-to-face contact.

## 2023-12-08 ENCOUNTER — Ambulatory Visit: Payer: Medicaid Other

## 2023-12-08 DIAGNOSIS — Z3480 Encounter for supervision of other normal pregnancy, unspecified trimester: Secondary | ICD-10-CM | POA: Diagnosis not present

## 2023-12-08 LAB — OB RESULTS CONSOLE GBS: GBS: NEGATIVE

## 2023-12-09 DIAGNOSIS — J4520 Mild intermittent asthma, uncomplicated: Secondary | ICD-10-CM | POA: Diagnosis not present

## 2023-12-09 DIAGNOSIS — Z111 Encounter for screening for respiratory tuberculosis: Secondary | ICD-10-CM | POA: Diagnosis not present

## 2023-12-15 ENCOUNTER — Other Ambulatory Visit: Payer: Self-pay

## 2023-12-15 ENCOUNTER — Ambulatory Visit: Payer: Medicaid Other | Admitting: *Deleted

## 2023-12-15 ENCOUNTER — Inpatient Hospital Stay (HOSPITAL_COMMUNITY)
Admission: RE | Admit: 2023-12-15 | Discharge: 2023-12-18 | DRG: 788 | Disposition: A | Discharge status: Home / Self Care | Payer: Medicaid Other | Source: Ambulatory Visit | Attending: Obstetrics and Gynecology | Admitting: Obstetrics and Gynecology

## 2023-12-15 ENCOUNTER — Encounter (HOSPITAL_COMMUNITY)
Admission: RE | Disposition: A | Discharge status: Home / Self Care | Payer: Self-pay | Source: Ambulatory Visit | Attending: Obstetrics and Gynecology

## 2023-12-15 ENCOUNTER — Inpatient Hospital Stay (HOSPITAL_COMMUNITY): Payer: Medicaid Other | Admitting: Anesthesiology

## 2023-12-15 ENCOUNTER — Ambulatory Visit: Payer: Medicaid Other | Attending: Obstetrics and Gynecology | Admitting: Obstetrics and Gynecology

## 2023-12-15 ENCOUNTER — Encounter (HOSPITAL_COMMUNITY): Payer: Self-pay | Admitting: Obstetrics and Gynecology

## 2023-12-15 ENCOUNTER — Ambulatory Visit: Payer: Medicaid Other

## 2023-12-15 VITALS — BP 126/77 | HR 67

## 2023-12-15 DIAGNOSIS — O30009 Twin pregnancy, unspecified number of placenta and unspecified number of amniotic sacs, unspecified trimester: Secondary | ICD-10-CM | POA: Diagnosis not present

## 2023-12-15 DIAGNOSIS — O365932 Maternal care for other known or suspected poor fetal growth, third trimester, fetus 2: Principal | ICD-10-CM | POA: Diagnosis present

## 2023-12-15 DIAGNOSIS — D573 Sickle-cell trait: Secondary | ICD-10-CM | POA: Diagnosis not present

## 2023-12-15 DIAGNOSIS — O9952 Diseases of the respiratory system complicating childbirth: Secondary | ICD-10-CM | POA: Diagnosis not present

## 2023-12-15 DIAGNOSIS — O26893 Other specified pregnancy related conditions, third trimester: Secondary | ICD-10-CM | POA: Diagnosis present

## 2023-12-15 DIAGNOSIS — O30043 Twin pregnancy, dichorionic/diamniotic, third trimester: Secondary | ICD-10-CM

## 2023-12-15 DIAGNOSIS — Z3A37 37 weeks gestation of pregnancy: Secondary | ICD-10-CM

## 2023-12-15 DIAGNOSIS — O9902 Anemia complicating childbirth: Secondary | ICD-10-CM | POA: Diagnosis present

## 2023-12-15 DIAGNOSIS — O365931 Maternal care for other known or suspected poor fetal growth, third trimester, fetus 1: Secondary | ICD-10-CM

## 2023-12-15 DIAGNOSIS — Z8249 Family history of ischemic heart disease and other diseases of the circulatory system: Secondary | ICD-10-CM | POA: Diagnosis not present

## 2023-12-15 DIAGNOSIS — L299 Pruritus, unspecified: Secondary | ICD-10-CM | POA: Diagnosis not present

## 2023-12-15 DIAGNOSIS — O99013 Anemia complicating pregnancy, third trimester: Secondary | ICD-10-CM

## 2023-12-15 DIAGNOSIS — O321XX2 Maternal care for breech presentation, fetus 2: Secondary | ICD-10-CM | POA: Diagnosis present

## 2023-12-15 DIAGNOSIS — J45909 Unspecified asthma, uncomplicated: Secondary | ICD-10-CM | POA: Diagnosis not present

## 2023-12-15 DIAGNOSIS — O30003 Twin pregnancy, unspecified number of placenta and unspecified number of amniotic sacs, third trimester: Principal | ICD-10-CM

## 2023-12-15 DIAGNOSIS — O365990 Maternal care for other known or suspected poor fetal growth, unspecified trimester, not applicable or unspecified: Secondary | ICD-10-CM | POA: Insufficient documentation

## 2023-12-15 DIAGNOSIS — Z3A00 Weeks of gestation of pregnancy not specified: Secondary | ICD-10-CM | POA: Diagnosis not present

## 2023-12-15 LAB — COMPREHENSIVE METABOLIC PANEL
ALT: 15 U/L (ref 0–44)
AST: 27 U/L (ref 15–41)
Albumin: 3.1 g/dL — ABNORMAL LOW (ref 3.5–5.0)
Alkaline Phosphatase: 242 U/L — ABNORMAL HIGH (ref 38–126)
Anion gap: 14 (ref 5–15)
BUN: <5 mg/dL — ABNORMAL LOW (ref 6–20)
CO2: 19 mmol/L — ABNORMAL LOW (ref 22–32)
Calcium: 9.6 mg/dL (ref 8.9–10.3)
Chloride: 107 mmol/L (ref 98–111)
Creatinine, Ser: 0.73 mg/dL (ref 0.44–1.00)
GFR, Estimated: >60 mL/min (ref >60)
Glucose, Bld: 60 mg/dL — ABNORMAL LOW (ref 70–99)
Potassium: 3.6 mmol/L (ref 3.5–5.1)
Sodium: 140 mmol/L (ref 135–145)
Total Bilirubin: 1.7 mg/dL — ABNORMAL HIGH (ref 0.0–1.2)
Total Protein: 6.6 g/dL (ref 6.5–8.1)

## 2023-12-15 LAB — CBC
HCT: 43.5 % (ref 36.0–46.0)
Hemoglobin: 14.8 g/dL (ref 12.0–15.0)
MCH: 29.7 pg (ref 26.0–34.0)
MCHC: 34 g/dL (ref 30.0–36.0)
MCV: 87.2 fL (ref 80.0–100.0)
Platelets: 320 10*3/uL (ref 150–400)
RBC: 4.99 MIL/uL (ref 3.87–5.11)
RDW: 14.9 % (ref 11.5–15.5)
WBC: 9.7 10*3/uL (ref 4.0–10.5)
nRBC: 0 % (ref 0.0–0.2)

## 2023-12-15 LAB — TYPE AND SCREEN
ABO/RH(D): B POS
Antibody Screen: NEGATIVE

## 2023-12-15 LAB — PROTEIN / CREATININE RATIO, URINE
Creatinine, Urine: 114 mg/dL
Protein Creatinine Ratio: 0.18 mg/mg{creat} — ABNORMAL HIGH (ref 0.00–0.15)
Total Protein, Urine: 20 mg/dL

## 2023-12-15 SURGERY — Surgical Case
Anesthesia: Spinal

## 2023-12-15 MED ORDER — HYDROMORPHONE HCL 1 MG/ML IJ SOLN: 0.2000 mg | INTRAMUSCULAR | Status: DC | PRN

## 2023-12-15 MED ORDER — OXYCODONE HCL 5 MG/5ML PO SOLN: 5.0000 mg | Freq: Once | ORAL | Status: DC | PRN

## 2023-12-15 MED ORDER — DIBUCAINE (PERIANAL) 1 % EX OINT: 1.0000 | TOPICAL_OINTMENT | CUTANEOUS | Status: DC | PRN

## 2023-12-15 MED ORDER — DEXAMETHASONE SODIUM PHOSPHATE 10 MG/ML IJ SOLN
INTRAMUSCULAR | Status: AC
  Filled 2023-12-15: qty 1

## 2023-12-15 MED ORDER — DIPHENHYDRAMINE HCL 50 MG/ML IJ SOLN
INTRAMUSCULAR | Status: DC | PRN
  Administered 2023-12-15: 25 mg via INTRAVENOUS

## 2023-12-15 MED ORDER — LACTATED RINGERS IV SOLN: INTRAVENOUS | Status: DC

## 2023-12-15 MED ORDER — PHENYLEPHRINE HCL-NACL 20-0.9 MG/250ML-% IV SOLN
INTRAVENOUS | Status: DC | PRN
  Administered 2023-12-15: 60 ug/min via INTRAVENOUS

## 2023-12-15 MED ORDER — WITCH HAZEL-GLYCERIN EX PADS: 1.0000 | MEDICATED_PAD | CUTANEOUS | Status: DC | PRN

## 2023-12-15 MED ORDER — ALBUTEROL SULFATE (2.5 MG/3ML) 0.083% IN NEBU: 2.5000 mg | INHALATION_SOLUTION | Freq: Four times a day (QID) | RESPIRATORY_TRACT | Status: DC | PRN

## 2023-12-15 MED ORDER — MORPHINE SULFATE (PF) 0.5 MG/ML IJ SOLN
INTRAMUSCULAR | Status: AC
  Filled 2023-12-15: qty 10

## 2023-12-15 MED ORDER — DIPHENHYDRAMINE HCL 50 MG/ML IJ SOLN: 12.5000 mg | INTRAMUSCULAR | Status: DC | PRN

## 2023-12-15 MED ORDER — KETOROLAC TROMETHAMINE 30 MG/ML IJ SOLN
30.0000 mg | Freq: Four times a day (QID) | INTRAMUSCULAR | Status: AC
  Administered 2023-12-16 (×3): 30 mg via INTRAVENOUS
  Filled 2023-12-15 (×3): qty 1

## 2023-12-15 MED ORDER — OXYTOCIN-SODIUM CHLORIDE 30-0.9 UT/500ML-% IV SOLN
2.5000 [IU]/h | INTRAVENOUS | Status: AC
  Administered 2023-12-15: 2.5 [IU]/h via INTRAVENOUS
  Filled 2023-12-15: qty 500

## 2023-12-15 MED ORDER — CEFAZOLIN SODIUM-DEXTROSE 2-4 GM/100ML-% IV SOLN
2.0000 g | INTRAVENOUS | Status: AC
  Administered 2023-12-15: 2 g via INTRAVENOUS

## 2023-12-15 MED ORDER — DIPHENHYDRAMINE HCL 25 MG PO CAPS: 25.0000 mg | ORAL_CAPSULE | Freq: Four times a day (QID) | ORAL | Status: DC | PRN

## 2023-12-15 MED ORDER — SENNOSIDES-DOCUSATE SODIUM 8.6-50 MG PO TABS
2.0000 | ORAL_TABLET | ORAL | Status: DC
  Administered 2023-12-16 – 2023-12-17 (×2): 2 via ORAL
  Filled 2023-12-15 (×3): qty 2

## 2023-12-15 MED ORDER — OXYTOCIN-SODIUM CHLORIDE 30-0.9 UT/500ML-% IV SOLN
INTRAVENOUS | Status: DC | PRN
  Administered 2023-12-15: 300 mL via INTRAVENOUS

## 2023-12-15 MED ORDER — TRANEXAMIC ACID-NACL 1000-0.7 MG/100ML-% IV SOLN
INTRAVENOUS | Status: AC
  Filled 2023-12-15: qty 100

## 2023-12-15 MED ORDER — KETOROLAC TROMETHAMINE 30 MG/ML IJ SOLN
30.0000 mg | Freq: Once | INTRAMUSCULAR | Status: AC | PRN
  Administered 2023-12-15: 30 mg via INTRAVENOUS

## 2023-12-15 MED ORDER — FENTANYL CITRATE (PF) 100 MCG/2ML IJ SOLN
INTRAMUSCULAR | Status: DC | PRN
  Administered 2023-12-15: 15 ug via INTRATHECAL

## 2023-12-15 MED ORDER — SOD CITRATE-CITRIC ACID 500-334 MG/5ML PO SOLN
30.0000 mL | ORAL | Status: AC
  Administered 2023-12-15: 30 mL via ORAL

## 2023-12-15 MED ORDER — SODIUM CHLORIDE 0.9 % IR SOLN
Status: DC | PRN
  Administered 2023-12-15: 1

## 2023-12-15 MED ORDER — MORPHINE SULFATE (PF) 0.5 MG/ML IJ SOLN
INTRAMUSCULAR | Status: DC | PRN
  Administered 2023-12-15: 150 ug via INTRATHECAL

## 2023-12-15 MED ORDER — BUPIVACAINE IN DEXTROSE 0.75-8.25 % IT SOLN
INTRATHECAL | Status: DC | PRN
  Administered 2023-12-15: 1.6 mL via INTRATHECAL

## 2023-12-15 MED ORDER — SIMETHICONE 80 MG PO CHEW: 80.0000 mg | CHEWABLE_TABLET | ORAL | Status: DC | PRN

## 2023-12-15 MED ORDER — OXYTOCIN-SODIUM CHLORIDE 30-0.9 UT/500ML-% IV SOLN
INTRAVENOUS | Status: AC
  Filled 2023-12-15: qty 500

## 2023-12-15 MED ORDER — HYDROMORPHONE HCL 1 MG/ML IJ SOLN: 0.2500 mg | INTRAMUSCULAR | Status: DC | PRN

## 2023-12-15 MED ORDER — MEPERIDINE HCL 25 MG/ML IJ SOLN
6.2500 mg | INTRAMUSCULAR | Status: DC | PRN
Start: 2023-12-15 — End: 2023-12-15

## 2023-12-15 MED ORDER — NALOXONE HCL 0.4 MG/ML IJ SOLN: 0.4000 mg | INTRAMUSCULAR | Status: DC | PRN

## 2023-12-15 MED ORDER — COCONUT OIL OIL
1.0000 | TOPICAL_OIL | Status: DC | PRN
  Administered 2023-12-16: 1 via TOPICAL

## 2023-12-15 MED ORDER — TETANUS-DIPHTH-ACELL PERTUSSIS 5-2.5-18.5 LF-MCG/0.5 IM SUSY: 0.5000 mL | PREFILLED_SYRINGE | Freq: Once | INTRAMUSCULAR | Status: DC

## 2023-12-15 MED ORDER — IBUPROFEN 600 MG PO TABS
600.0000 mg | ORAL_TABLET | Freq: Four times a day (QID) | ORAL | Status: DC
  Administered 2023-12-16 – 2023-12-18 (×6): 600 mg via ORAL
  Filled 2023-12-15 (×6): qty 1

## 2023-12-15 MED ORDER — MENTHOL 3 MG MT LOZG: 1.0000 | LOZENGE | OROMUCOSAL | Status: DC | PRN

## 2023-12-15 MED ORDER — PHENYLEPHRINE HCL-NACL 20-0.9 MG/250ML-% IV SOLN
INTRAVENOUS | Status: AC
Start: 2023-12-15 — End: ?
  Filled 2023-12-15: qty 250

## 2023-12-15 MED ORDER — OXYCODONE HCL 5 MG PO TABS: 5.0000 mg | ORAL_TABLET | ORAL | Status: DC | PRN

## 2023-12-15 MED ORDER — ONDANSETRON HCL 4 MG/2ML IJ SOLN
INTRAMUSCULAR | Status: AC
Start: 2023-12-15 — End: ?
  Filled 2023-12-15: qty 2

## 2023-12-15 MED ORDER — SODIUM CHLORIDE 0.9 % IV SOLN: 12.5000 mg | INTRAVENOUS | Status: DC | PRN

## 2023-12-15 MED ORDER — ACETAMINOPHEN 10 MG/ML IV SOLN
INTRAVENOUS | Status: DC | PRN
  Administered 2023-12-15: 1000 mg via INTRAVENOUS

## 2023-12-15 MED ORDER — ACETAMINOPHEN 500 MG PO TABS
1000.0000 mg | ORAL_TABLET | Freq: Four times a day (QID) | ORAL | Status: DC
  Administered 2023-12-16 – 2023-12-18 (×9): 1000 mg via ORAL
  Filled 2023-12-15 (×9): qty 2

## 2023-12-15 MED ORDER — DIPHENHYDRAMINE HCL 25 MG PO CAPS: 25.0000 mg | ORAL_CAPSULE | ORAL | Status: DC | PRN

## 2023-12-15 MED ORDER — SOD CITRATE-CITRIC ACID 500-334 MG/5ML PO SOLN
ORAL | Status: AC
  Filled 2023-12-15: qty 30

## 2023-12-15 MED ORDER — STERILE WATER FOR IRRIGATION IR SOLN
Status: DC | PRN
  Administered 2023-12-15: 1000 mL

## 2023-12-15 MED ORDER — CEFAZOLIN SODIUM-DEXTROSE 2-4 GM/100ML-% IV SOLN
INTRAVENOUS | Status: AC
  Filled 2023-12-15: qty 100

## 2023-12-15 MED ORDER — TRANEXAMIC ACID-NACL 1000-0.7 MG/100ML-% IV SOLN
1000.0000 mg | Freq: Once | INTRAVENOUS | Status: AC
  Administered 2023-12-15: 1000 mg via INTRAVENOUS

## 2023-12-15 MED ORDER — SODIUM CHLORIDE 0.9% FLUSH: 3.0000 mL | INTRAVENOUS | Status: DC | PRN

## 2023-12-15 MED ORDER — OXYCODONE HCL 5 MG PO TABS: 5.0000 mg | ORAL_TABLET | Freq: Once | ORAL | Status: DC | PRN

## 2023-12-15 MED ORDER — DEXAMETHASONE SODIUM PHOSPHATE 10 MG/ML IJ SOLN
INTRAMUSCULAR | Status: DC | PRN
Start: 2023-12-15 — End: 2023-12-15
  Administered 2023-12-15 (×2): 5 mg via INTRAVENOUS

## 2023-12-15 MED ORDER — ACETAMINOPHEN 10 MG/ML IV SOLN
INTRAVENOUS | Status: AC
  Filled 2023-12-15: qty 100

## 2023-12-15 MED ORDER — ONDANSETRON HCL 4 MG/2ML IJ SOLN
INTRAMUSCULAR | Status: DC | PRN
  Administered 2023-12-15: 4 mg via INTRAVENOUS

## 2023-12-15 MED ORDER — SIMETHICONE 80 MG PO CHEW
80.0000 mg | CHEWABLE_TABLET | Freq: Three times a day (TID) | ORAL | Status: DC
  Administered 2023-12-16 – 2023-12-18 (×6): 80 mg via ORAL
  Filled 2023-12-15 (×6): qty 1

## 2023-12-15 MED ORDER — SCOPOLAMINE 1 MG/3DAYS TD PT72
1.0000 | MEDICATED_PATCH | TRANSDERMAL | Status: DC
  Administered 2023-12-15: 1.5 mg via TRANSDERMAL
  Filled 2023-12-15: qty 1

## 2023-12-15 MED ORDER — DIPHENHYDRAMINE HCL 50 MG/ML IJ SOLN
INTRAMUSCULAR | Status: AC
  Filled 2023-12-15: qty 1

## 2023-12-15 MED ORDER — PRENATAL MULTIVITAMIN CH
1.0000 | ORAL_TABLET | Freq: Every day | ORAL | Status: DC
  Administered 2023-12-16 – 2023-12-17 (×2): 1 via ORAL
  Filled 2023-12-15 (×3): qty 1

## 2023-12-15 MED ORDER — NALOXONE HCL 4 MG/10ML IJ SOLN: 1.0000 ug/kg/h | INTRAVENOUS | Status: DC | PRN

## 2023-12-15 MED ORDER — FENTANYL CITRATE (PF) 100 MCG/2ML IJ SOLN
INTRAMUSCULAR | Status: AC
  Filled 2023-12-15: qty 2

## 2023-12-15 SURGICAL SUPPLY — 23 items
BENZOIN TINCTURE PRP APPL 2/3 (GAUZE/BANDAGES/DRESSINGS) IMPLANT
CHLORAPREP W/TINT 26 (MISCELLANEOUS) IMPLANT
CLAMP UMBILICAL CORD (MISCELLANEOUS) IMPLANT
CLOTH BEACON ORANGE TIMEOUT ST (SAFETY) IMPLANT
DRSG OPSITE POSTOP 4X10 (GAUZE/BANDAGES/DRESSINGS) IMPLANT
ELECT REM PT RETURN 9FT ADLT (ELECTROSURGICAL) ×1 IMPLANT
ELECTRODE REM PT RTRN 9FT ADLT (ELECTROSURGICAL) IMPLANT
GLOVE BIO SURGEON STRL SZ 6.5 (GLOVE) IMPLANT
GLOVE BIOGEL PI IND STRL 7.0 (GLOVE) IMPLANT
GOWN STRL REUS W/TWL LRG LVL3 (GOWN DISPOSABLE) IMPLANT
LIGASURE IMPACT 36 18CM CVD LR (INSTRUMENTS) IMPLANT
NS IRRIG 1000ML POUR BTL (IV SOLUTION) IMPLANT
PACK C SECTION WH (CUSTOM PROCEDURE TRAY) IMPLANT
PAD OB MATERNITY 11 LF (PERSONAL CARE ITEMS) IMPLANT
RTRCTR C-SECT PINK 25CM LRG (MISCELLANEOUS) IMPLANT
STRIP CLOSURE SKIN 1/2X4 (GAUZE/BANDAGES/DRESSINGS) IMPLANT
SUT MNCRL 0 VIOLET CTX 36 (SUTURE) IMPLANT
SUT VIC AB 2-0 CT1 TAPERPNT 27 (SUTURE) IMPLANT
SUT VIC AB 4-0 KS 27 (SUTURE) IMPLANT
SUTURE PLAIN GUT 2.0 ETHICON (SUTURE) IMPLANT
TOWEL OR 17X24 6PK STRL BLUE (TOWEL DISPOSABLE) IMPLANT
TRAY FOLEY W/BAG SLVR 14FR LF (SET/KITS/TRAYS/PACK) IMPLANT
WATER STERILE IRR 1000ML POUR (IV SOLUTION) IMPLANT

## 2023-12-15 NOTE — Anesthesia Postprocedure Evaluation (Signed)
 Anesthesia Post Note  Patient: Psychologist, clinical  Procedure(s) Performed: CESAREAN SECTION MULTI-GESTATIONAL     Patient location during evaluation: PACU Anesthesia Type: Spinal Level of consciousness: awake and alert Pain management: pain level controlled Vital Signs Assessment: post-procedure vital signs reviewed and stable Respiratory status: spontaneous breathing, nonlabored ventilation and respiratory function stable Cardiovascular status: blood pressure returned to baseline and stable Postop Assessment: no apparent nausea or vomiting Anesthetic complications: no   No notable events documented.  Last Vitals:  Vitals:   12/15/23 2015 12/15/23 2030  BP: 108/77   Pulse: 68 62  Resp: (!) 23 18  Temp:    SpO2: 98% 98%    Last Pain:  Vitals:   12/15/23 2043  TempSrc:   PainSc: 0-No pain   Pain Goal:                   Lowella Curb

## 2023-12-15 NOTE — Transfer of Care (Signed)
 Immediate Anesthesia Transfer of Care Note  Patient: Sarah [name]  Procedure(s) Performed: CESAREAN SECTION MULTI-GESTATIONAL  Patient Location: PACU  Anesthesia Type:Spinal  Level of Consciousness: awake  Airway & Oxygen Therapy: Patient Spontanous Breathing  Post-op Assessment: Report given to RN and Post -op Vital signs reviewed and stable  Post vital signs: Reviewed and stable  Last Vitals:  Vitals Value Taken Time  BP    Temp    Pulse    Resp    SpO2      Last Pain:  Vitals:   12/15/23 1640  TempSrc: Oral         Complications: No notable events documented.

## 2023-12-15 NOTE — Op Note (Signed)
 CESAREAN SECTION Procedure Note  Patient: Sarah [name] is a 28 y.o. G3P1011 @ [redacted]w[redacted]d  Preoperative Diagnosis:  Intrauterine pregnancy at 37 weeks 0 days Dichorionic diamniotic twin pregnancy Fetal growth restriction of both twins Malpresentation of baby B  Postoperative Diagnosis: same, delivered  Procedure: Primary cesarean     Surgeon: Charlett Nose, MD  Assistant: Steva Ready, MD  Anesthesia: Spinal anesthesia   Findings: Normal appearing uterus, fallopian tubes bilaterally, and ovaries bilaterally.   Baby A: Viable female infant in vertex presentation delivered at 1843 with weight 2430g (5 pounds 5.7 ounces) Apgars pending. Baby B: viable female infant in complete breech presentation delivered at 1844 with weight 2140g (4lb 11.5oz), Apgars 8 at 1 min and 8 at 5 mins.   Estimated Blood Loss:          Specimens: Placentas to pathology (cord clamp on cord A)         Complications:  None         Disposition: PACU - hemodynamically stable.         Condition: stable    Description of Procedure: The patient was taken to the operating room where spinal anesthesia was placed and found to be adequate.  The patient was placed in the dorsal supine position.  Fetal heart tones were confirmed. Thromboguards were applied and cycling. A foley catheter was inserted and draining. Ancef 2g was given for infection prophylaxis. Tranexamic acid 1g was given for bleeding prophylaxis. The patient was subsequently prepped and draped in the normal sterile fashion.    A low transverse skin incision was made with a scalpel and carried down to the level of the fascia with the Bovie. During this process, the tip of the inactivated Bovie brushed the patient's skin and made a shallow burn mark superior to the left angle of the incision. The fascia was incised in the midline with the scalpel and extended laterally with curved Mayo scissors.  Kocher clamps were applied to the inferior  fascial edge and the fascia was dissected off the rectus muscle sharply using the Mayo scissors.  The Kocher clamps were transferred to the superior fascial edge and the underlying rectus muscle was dissected off with curved Mayo's scissors.  The rectus muscles then were separated in the midline.  The peritoneum was found free of adherent bowel and the peritoneal cavity was entered with Metzenbaum scissors.  The uterus was identified and the alexis retractor was placed intraperitoneal.  A bladder flap was then created sharply with Metzenbaum scissors and separated from the lower uterine segment digitally.   A low transverse hysterotomy was then made with a scalpel. The amniotic sac was ruptured for clear fluid. Baby A was found in the vertex presentation was delivered atraumatically and without difficulty with standard maneuvers. After 30 seconds of delayed cord clamping the cord was clamped and cut and the infant was handed off to the pediatricians.  At this time, the amniotic sac of baby B was almost halfway out of the hysterotomy. With fundal pressure, baby B was delivered en caul in the complete breech presentation. The amniotic sac was ruptured for clear fluid. After 60 seconds of delay, the cord was clamped and cut and the infant was handed off to the pediatricians. An umbilical clamp was placed on the cord of baby A.  The placentas were delivered with gentle traction on umbilical cord and manual massage of the uterine fundus.  The uterus was cleared of all clot and debris.  The hysterotomy was then closed with 0 monocryl in a running locked fashion,  followed by 0 Monocryl in an imbricating fashion.  The hysterotomy was found to be hemostatic. The peritoneum was closed with 2-0 vicryl in running fashion followed by reapproximation of the rectus muscles. The fascia was closed with a 0 Vicryl suture in a continuous running fashion.  The subcutaneous tissue was irrigated and rendered hemostatic with cautery.   The skin was closed with 4-0 vicryl  in a running subcuticular fashion.  A subcuticular stitch of 4-0 vicryl reapproximated the 5mm burn, which was covered with vaseline and a steri strip. Sponge, lap and needle counts were correct. Steri strips and a Honeycomb dressing were placed on the incision.   The patient was taken to the recovery room in stable condition. She was notified of the burn mark and a Safety Zone report was made as the Bovie tip was not activated at the time.   Charlett Nose 12/15/23  7:25 PM

## 2023-12-15 NOTE — Progress Notes (Signed)
 Maternal-Fetal Medicine Consultation I had the pleasure of seeing Sarah [name] today at [facility]. She is G3 P1011 at 37-weeks' gestation and is here for fetal growth assessment and antenatal testing. She has dichorionic-diamniotic twin pregnancy with selective fetal growth restriction in twin B.  She does not have hypertension or diabetes.  Blood pressure today at our office is 126/77 mmHg.  Ultrasound Dichorionic-diamniotic twin pregnancy.  Twin A: Maternal left, cephalic presentation, posterior placenta, female fetus.  Amniotic fluid is normal good fetal activity seen.  Umbilical artery Doppler showed normal forward diastolic flow.  The estimated fetal weight is at the 8 percentile (interval weight gain 570 g over 4 weeks).  Abdominal circumference measurement is at the 5th percentile.  Antenatal testing is reassuring.  BPP 8/8.  Twin B: Maternal right, breech presentation, anterior placenta, female fetus.  Amniotic fluid is normal good fetal activity seen.  Umbilical artery Doppler showed normal forward diastolic flow.  The estimated fetal weight is at the 2nd percentile and the abdominal circumference measurement is at the 1st percentile.  Head circumference measurement is at between -2 and -1 SD (normal).  Interval weight gain is 517 g over 4 weeks.  Antenatal testing is reassuring.  BPP 8/8.  Our concerns include: Dichorionic-diamniotic twin pregnancy with fetal growth restriction in both twins. Twin B has severe fetal growth restriction.  I reassured her of normal antenatal testing.  Ultrasound has limitations in accurately estimating fetal weights. I counseled the patient that fetal growth restriction is associated with increased risk of perinatal morbidity and mortality.  In twins complicated with fetal growth restriction (severe in twin B), we recommend delivery at 36 to [redacted] weeks gestation.  I recommended a nonurgent delivery now.  She has an appointment at  your office today and we will discuss timing of delivery.  She had questions about mode of delivery.  Since twin A has cephalic presentation, vaginal delivery of twin A followed by internal podalic version of twin B is an option.  I informed the patient that her provider will determine the mode of delivery and consultation with her.  Recommendations -Delivery now. -I will be discussing with Dr. Tenny Craw.   Consultation including face-to-face (more than 50%) counseling 20 minutes.

## 2023-12-15 NOTE — Interval H&P Note (Signed)
 History and Physical Interval Note:  12/15/2023 5:37 PM  Sarah [name]  has presented today for surgery, with the diagnosis of twins, growth restriction baby b.  The various methods of treatment have been discussed with the patient and family. After consideration of risks, benefits and other options for treatment, the patient has consented to  Procedure(s): CESAREAN SECTION MULTI-GESTATIONAL (N/A) as a surgical intervention.  The patient's history has been reviewed, patient examined, no change in status, stable for surgery.  I have reviewed the patient's chart and labs.  Questions were answered to the patient's satisfaction.     Charlett Nose

## 2023-12-15 NOTE — H&P (Addendum)
 Sarah [name] is a 28 y.o. female G3P1011 @ 37.0 with DCDA twin pregnancy and fetal growth restriction presenting for delivery.  Patient was seen at [facility]FM today for growth scan. Baby A is measuring at the 8th percentile and baby B is at the 2nd percentile. Dr. Judeth Cornfield (MFM) recommends delivery today.   After MFM appointment she was seen in OB office by Dr. Tenny Craw. They discussed recommendation for delivery as well as delivery options, as babies are vertex/breech. Given severe growth restriction in B, there is an increased morbidity risk for baby B given likely need for breech extraction maneuvers, in addition to usually risks for breech vaginal deliveries. Patient agreed to primary cesarean. She last ate at 0400 and last drank at 1030.   Pregnancy has been complicated by: DCDA twin pregnancy with fetal growth restriction: first noted in baby B at 32 weeks, now today noted in both babies.  Sickle cell trait: FOB negative Asthma: stable on albuterol PRN, flovent   OB History     Gravida  3   Para  1   Term  1   Preterm      AB  1   Living  1      SAB  1   IAB      Ectopic      Multiple  0   Live Births  1          Past Medical History:  Diagnosis Date   Asthma    Normal labor 05/25/2021   Vaginal Pap smear, abnormal    Past Surgical History:  Procedure Laterality Date   TONSILLECTOMY     Family History: family history includes Healthy in her father and mother; Hypertension in her maternal grandmother. Social History:  reports that she has never smoked. She has never been exposed to tobacco smoke. She has never used smokeless tobacco. She reports that she does not drink alcohol and does not use drugs.     Maternal Diabetes: No Genetic Screening: Normal Maternal Ultrasounds/Referrals: IUGR Fetal Ultrasounds or other Referrals:  Referred to Materal Fetal Medicine  Maternal Substance Abuse:  No Significant Maternal Medications:  None Significant Maternal Lab  Results:  Group B Strep negative Number of Prenatal Visits:greater than 3 verified prenatal visits Maternal Vaccinations: none Other Comments:  None  Review of Systems as not in HPI History   Last menstrual period 03/31/2023, unknown if currently breastfeeding. Exam Physical Exam   Gen: well appearing CVS: normal pulses Lungs: nonlabored respirations Abd: gravid, Leopolds 6.5lbs Ext: no calf edema or tenderness SVE:  deferred  Ultrasound 12/15/23 Dichorionic-diamniotic twin pregnancy.   Twin A: 2498 gm 5 lb 8 oz  Maternal left, cephalic presentation, posterior placenta, female fetus.  Amniotic fluid is normal good fetal activity seen.  Umbilical artery Doppler showed normal forward diastolic flow.  The estimated fetal weight is at the 8 percentile (interval weight gain 570 g over 4 weeks).  Abdominal circumference measurement is at the 5th percentile.  Antenatal testing is reassuring.  BPP 8/8.   Twin B: 2206 gm 4 lb 14 oz  Maternal right, breech presentation, anterior placenta, female fetus.  Amniotic fluid is normal good fetal activity seen.  Umbilical artery Doppler showed normal forward diastolic flow.  The estimated fetal weight is at the 2nd percentile and the abdominal circumference measurement is at the 1st percentile.  Head circumference measurement is at between -2 and -1 SD (normal).  Interval weight gain is 517 g over 4 weeks.  Antenatal testing is reassuring.  BPP 8/8.  Prenatal labs: ABO, Rh: B/Positive/-- (09/05 0000) Antibody:   Rubella: Immune (09/05 0000) RPR: Nonreactive (09/05 0000)  HBsAg: Negative (09/05 0000)  HIV: Non-reactive (09/05 0000)  GBS: Negative/-- (02/20 0000)   Assessment/Plan: 44W [id number] @ [redacted]w[redacted]d, DCDA twins, severe growth restriction in baby B - Plan: primary cesarean delivery - Ancef 2g - Plan TXA for bleeding prophylaxis   Charlett Nose 12/15/2023, 2:58 PM

## 2023-12-15 NOTE — Anesthesia Preprocedure Evaluation (Signed)
 Anesthesia Evaluation  Patient identified by MRN, date of birth, ID band Patient awake    Reviewed: Allergy & Precautions, H&P , NPO status , Patient's Chart, lab work & pertinent test results, reviewed documented beta blocker date and time   Airway Mallampati: I  TM Distance: >3 FB Neck ROM: full    Dental no notable dental hx. (+) Teeth Intact, Dental Advisory Given   Pulmonary neg pulmonary ROS, asthma    Pulmonary exam normal breath sounds clear to auscultation       Cardiovascular negative cardio ROS Normal cardiovascular exam Rhythm:regular Rate:Normal     Neuro/Psych negative neurological ROS  negative psych ROS   GI/Hepatic negative GI ROS, Neg liver ROS,,,  Endo/Other  negative endocrine ROS    Renal/GU negative Renal ROS  negative genitourinary   Musculoskeletal   Abdominal   Peds  Hematology  (+) Blood dyscrasia, Sickle cell trait   Anesthesia Other Findings   Reproductive/Obstetrics (+) Pregnancy                             Anesthesia Physical Anesthesia Plan  ASA: 2  Anesthesia Plan: Spinal   Post-op Pain Management: Minimal or no pain anticipated   Induction: Intravenous  PONV Risk Score and Plan: 2 and Scopolamine patch - Pre-op  Airway Management Planned: Natural Airway and Simple Face Mask  Additional Equipment: None  Intra-op Plan:   Post-operative Plan:   Informed Consent: I have reviewed the patients History and Physical, chart, labs and discussed the procedure including the risks, benefits and alternatives for the proposed anesthesia with the patient or authorized representative who has indicated his/her understanding and acceptance.       Plan Discussed with: Anesthesiologist and CRNA  Anesthesia Plan Comments:        Anesthesia Quick Evaluation

## 2023-12-15 NOTE — Anesthesia Procedure Notes (Signed)
 Spinal  Patient location during procedure: OR Start time: 12/15/2023 6:18 PM End time: 12/15/2023 6:23 PM Reason for block: surgical anesthesia Staffing Performed: anesthesiologist  Anesthesiologist: Lowella Curb, MD Performed by: Lowella Curb, MD Authorized by: Lowella Curb, MD   Preanesthetic Checklist Completed: patient identified, IV checked, site marked, risks and benefits discussed, surgical consent, monitors and equipment checked, pre-op evaluation and timeout performed Spinal Block Patient position: sitting Prep: DuraPrep Patient monitoring: heart rate, cardiac monitor, continuous pulse ox and blood pressure Approach: midline Location: L3-4 Injection technique: single-shot Needle Needle type: Sprotte  Needle gauge: 24 G Needle length: 9 cm Assessment Sensory level: T4 Events: CSF return

## 2023-12-16 ENCOUNTER — Encounter (HOSPITAL_COMMUNITY): Payer: Self-pay | Admitting: Obstetrics and Gynecology

## 2023-12-16 LAB — CBC
HCT: 35.8 % — ABNORMAL LOW (ref 36.0–46.0)
Hemoglobin: 12.4 g/dL (ref 12.0–15.0)
MCH: 30.4 pg (ref 26.0–34.0)
MCHC: 34.6 g/dL (ref 30.0–36.0)
MCV: 87.7 fL (ref 80.0–100.0)
Platelets: 250 10*3/uL (ref 150–400)
RBC: 4.08 MIL/uL (ref 3.87–5.11)
RDW: 14.6 % (ref 11.5–15.5)
WBC: 10.7 10*3/uL — ABNORMAL HIGH (ref 4.0–10.5)
nRBC: 0.2 % (ref 0.0–0.2)

## 2023-12-16 LAB — RPR: RPR Ser Ql: NONREACTIVE

## 2023-12-16 NOTE — Lactation Note (Signed)
 This note was copied from a baby's chart. Lactation Consultation Note  Patient Name: Lauriann Milillo UEAVW'U Date: 12/16/2023 Age:27 hours Reason for consult: Initial assessment;Early term 37-38.6wks;Infant < 6lbs;Multiple gestation Mom was awake. Mom stated baby took only 6 ml of the formula. Discussed feeding plan. Mom is going to pump and bottle feed. Mom doesn't want to put the babies to the breast. Mom is giving formula until her milk comes in. [facility] suggested if mom is feeding baby bottle and she only takes a little bit, let the baby take a nap and then before the bottle has expired ( hr is up) try to give the baby more formula/BM. To equal the amount she needs to get. Mom stated OK. Noted baby had yellow nipple. LC got white nipple. Baby took well. No spillage noted. Education on pumping, feeding babies, twins in general discussion. Answered any questions mom had. Encouraged mom to pump q 3hr. Mom pumped while LC in rm. Giving baby girl formula. Encouraged mom to call for assistance or questions as needed.  Maternal Data    Feeding Mother's Current Feeding Choice: Breast Milk and Formula Nipple Type: Nfant Standard Flow (white)  LATCH Score       Type of Nipple: Everted at rest and after stimulation  Comfort (Breast/Nipple): Soft / non-tender         Lactation Tools Discussed/Used Tools: Pump;Flanges Flange Size: 21 Breast pump type: Double-Electric Breast Pump Reason for Pumping: twins/less than 6 lbs  Interventions Interventions: Education;Pace feeding;LC Services brochure  Discharge    Consult Status Consult Status: Follow-up Date: 12/16/23 (in pm) Follow-up type: In-patient    Charyl Dancer 12/16/2023, 12:36 AM

## 2023-12-16 NOTE — Progress Notes (Signed)
 Subjective: Postpartum Day 1: Cesarean Delivery Doing well this AM. Pumping well. Tolerating PO. Foley out, pending void. Pain controlled. +pruritus.   Baby boy in NICU  Objective: Vital signs in last 24 hours: Temp:  [97.4 F (36.3 C)-98.9 F (37.2 C)] 98.5 F (36.9 C) (02/28 0510) Pulse Rate:  [54-85] 56 (02/28 0510) Resp:  [15-25] 18 (02/28 0510) BP: (108-132)/(18-84) 112/60 (02/28 0510) SpO2:  [96 %-100 %] 98 % (02/28 0510) Weight:  [80.4 kg] 80.4 kg (02/27 1636)  Physical Exam:  General: alert, cooperative, and no distress Lochia: appropriate Uterine Fundus: firm Incision: Honeycomb in place, no strikethrough DVT Evaluation: No evidence of DVT seen on physical exam. No significant calf/ankle edema.  Recent Labs    12/15/23 1657 12/16/23 0501  HGB 14.8 12.4  HCT 43.5 35.8*    Assessment/Plan: 27 y.o. G3P2013 PPD#1 sp PLTCS for DCDA twin pregnancy with severe FGR 1. PPC: routine PP care. Offered stadol for pruritus, she declines for now 2. RH pos 3. Asthma: albuterol PRN, none needed inpatient 4: Desire circ for baby boy prior to DC, currently in NICU  Junius Creamer, DO 12/16/2023, 8:18 AM

## 2023-12-16 NOTE — Social Work (Signed)
 Patient screened out for psychosocial assessment since none of the following apply: Psychosocial stressors documented in mother or baby's chart Gestation less than 32 weeks Code at delivery  Infant with anomalies  Please contact the Clinical Social Worker if specific needs arise, by MOB's request, or if MOB scores greater than 9/yes to question 10 on [place] Postpartum Depression Screen.  Vivi Barrack, MSW, LCSW Clinical Social Worker  [phone] 27-Aug-2024  11:32 AM

## 2023-12-16 NOTE — Lactation Note (Signed)
 This note was copied from [name] baby's chart.  NICU Lactation Consultation Note  Patient Name: Sarah [name] DVVOH'Y Date: 12/16/2023 Age:27 hours  Reason for consult: Initial assessment; NICU baby; Early term 37-38.6wks; Infant < 6lbs  SUBJECTIVE  LC in to visit with P2 Mom of ET twins.  [name] "Sarah [name]" is in the NICU due to need of respiratory support.  Baby is now on room air.  [name] weight was 5 lbs 5.7 oz.  Mom was assisted to start pumping within 6 hrs of birth.  Mom states she has pumped 3 times already, and collected 10-15 ml.  Mom is primarily planning to pump and bottle, but is open to breastfeeding.  LC provided Mom with [name] hands free pumping top and assisted her to pump while holding [name] : STS.   Encouraged breast massage and hand expression along with consistent pumping to support [name] full milk supply.  FOB took 10 ml of colostrum to baby and will receive breast milk labels to properly label expression date and time.  LC provided [name] drying bin labeling it along with [name] washing bin.  LC noted pump parts soaking in cold water.  Educated Mom that soaking is not recommended longer than 5 mins.  LC demonstrated disassembling pump parts, washing, rinsing and air drying in separate bin.  LC told Mom that colostrum protectors are to be discarded after 24 hrs of use.  Plan recommended- 1-STS with babies  2- Pump both breasts on initiation setting every 3 hrs with [name] goal of 8 times per 24 hrs. 3- ask for LC support prn if decides to latch baby to the breast.  OBJECTIVE Infant data: Mother's Current Feeding Choice: Breast Milk and Formula  O2 Device: Room Air FiO2 (%): 21 %  Infant feeding assessment No data recorded  Maternal data: G3P2013 C-Section, Low Transverse Has patient been taught Hand Expression?: Yes Hand Expression Comments: reviewed this Significant Breast History:: ++ breast changes Current breast feeding challenges:: ET twins, baby [name] in  the NICU for respiratory support Does the patient have breastfeeding experience prior to this delivery?: Yes Pumping frequency: Mom encouraged to pump every 3 hrs Pumped volume: 10 mL Flange Size: 21 Hands-free pumping top sizes: Small/Medium (Blue) Risk factor for low/delayed milk supply:: Infant separation  Pump: DEBP, Personal ([employer] PIS from insurance)  ASSESSMENT Feeding Status: NPO  Maternal: Milk volume: Normal  INTERVENTIONS/PLAN Interventions: Interventions: Breast feeding basics reviewed; Skin to skin; Breast massage; Hand express; DEBP; Education; [employer] brochure; CDC milk storage guidelines Tools: Pump; Flanges; Hands-free pumping top Pump Education: Setup, frequency, and cleaning; Milk Storage  Plan: Consult Status: NICU follow-up NICU Follow-up type: New admission follow up   Sarah [name] 12/16/2023, 12:44 PM

## 2023-12-17 NOTE — Progress Notes (Signed)
 Subjective: Postpartum Day 2: Cesarean Delivery Patient reports pain controlled, no nausea or vomiting, tolerating p.o.  Voiding and ambulating without difficulty.  Baby boy had been in the NICU but is now at the bedside.  Objective: Vital signs in last 24 hours: Temp:  [98.2 F (36.8 C)-98.5 F (36.9 C)] 98.2 F (36.8 C) (03/01 0525) Pulse Rate:  [58-83] 83 (03/01 0525) Resp:  [16-18] 16 (03/01 0525) BP: (115-126)/(59-77) 117/77 (03/01 0525) SpO2:  [99 %-100 %] 99 % (03/01 0525)  Physical Exam:  General: alert, cooperative, and appears stated age Lochia: appropriate Uterine Fundus: firm Incision: healing well DVT Evaluation: No evidence of DVT seen on physical exam.  Recent Labs    12/15/23 1657 12/16/23 0501  HGB 14.8 12.4  HCT 43.5 35.8*    Assessment/Plan: Status post Cesarean section. Doing well postoperatively.  Continue current care. Desires neonatal circumcision, R/B/A of procedure discussed at length. Pt understands that neonatal circumcision is not considered medically necessary and is elective. The risks include, but are not limited to bleeding, infection, damage to the penis, development of scar tissue, and having to have it redone at a later date. Pt understands theses risks and wishes to proceed.  Given baby is just out of the NICU will postpone circumcision until tomorrow  Waynard Reeds, MD 12/17/2023, 12:08 PM

## 2023-12-18 MED ORDER — OXYCODONE HCL 5 MG PO TABS: 5.0000 mg | ORAL_TABLET | ORAL | 0 refills | Status: AC | PRN

## 2023-12-18 MED ORDER — IBUPROFEN 600 MG PO TABS: 600.0000 mg | ORAL_TABLET | Freq: Four times a day (QID) | ORAL | 0 refills | Status: AC | PRN

## 2023-12-18 NOTE — Discharge Summary (Signed)
 Postpartum Discharge Summary       Patient Name: Sarah [name] DOB: [birth year] MRN: [id number]  Date of admission: 12/15/2023 Delivery date:   Blayke, Pinera [[id number]]  12/15/2023    Jeree, Delcid [[id number]]  12/15/2023 Delivering provider:    Aariel, Ems [[id number]]  MARINONE, Nadea, Kirkland Muskan [[id number]]  Derl Barrow E Date of discharge: 12/18/2023  Admitting diagnosis: Twin pregnancy [O30.009] Intrauterine pregnancy: [redacted]w[redacted]d     Secondary diagnosis:  Principal Problem:   Twin pregnancy  Additional problems: IUGR x 2    Discharge diagnosis: Term Pregnancy Delivered                                              Post partum procedures: NA Augmentation: N/A Complications: None  Hospital course: Sceduled C/S   28 y.o. yo G3P2013 at [redacted]w[redacted]d was admitted to the hospital 12/15/2023 for scheduled cesarean section with the following indication:Multifetal Gestation and IUGR x2, Malpresentation of Twin B .Delivery details are as follows:  Membrane Rupture Time/Date:    Kimberl, Vig [[id number]]  6:43 PM    Elide, Stalzer [[id number]]  6:44 PM,   Jisel, Fleet [[id number]]  12/15/2023    Tameko, Halder [[id number]]  12/15/2023  Delivery Method:   Valrie Hart [[id number]]  C-Section, Low Transverse    Daveah, Varone [[id number]]  C-Section, Low Transverse Operative Delivery:N/A Details of operation can be found in separate operative note.  Patient had a postpartum course complicated by nothing.  She is ambulating, tolerating a regular diet, passing flatus, and urinating well. Patient is discharged home in stable condition on  12/18/23        Newborn Data: Birth date:   Yvonna, Brun [[id number]]  12/15/2023    Ioanna, Colquhoun [[id number]]  12/15/2023 Birth time:   Sundeep, Cary [[id number]]  6:43 PM    Buren Kos  [[id number]]  6:44 PM Gender:   Vandora, Jaskulski [[id number]]  Female    Keeshia, Sanderlin [[id number]]  Female Living status:   Fleeta, Kunde [[id number]]  Living    [address] [[id number]]  Living Apgars:   Dasia, Guerrier [[id number]]  [address] [[id number]]  8 ,   Makaya, [place] [[id number]]  [address] [place] [[id number]]  8  Weight:   Azalya, Galyon [[id number]]  2430 g    Cienna, Dumais Chianna [[id number]]  2140 g    Magnesium Sulfate received: No BMZ received: No Rhophylac:N/A Immunizations administered: There is no immunization history for the selected administration types on file for this patient.  Physical exam  Vitals:   12/17/23 0525 12/17/23 1430 12/17/23 2048 12/18/23 0525  BP: 117/77 121/64 123/69 135/73  Pulse: 83 63 60 (!) 51  Resp: 16 16 18 20   Temp: 98.2 F (36.8 C) 98.7 F (37.1 C) 99 F (37.2 C) 98.4 F (36.9 C)  TempSrc: Oral Oral Oral Oral  SpO2: 99% 100% 100% 100%  Weight:      Height:       General: alert, cooperative, and no distress Lochia: appropriate Uterine Fundus: firm Incision: Healing well with no significant drainage DVT Evaluation: No evidence of DVT seen on physical exam. Labs: Lab Results  Component Value Date   WBC 10.7 (  H) 12/16/2023   HGB 12.4 12/16/2023   HCT 35.8 (L) 12/16/2023   MCV 87.7 12/16/2023   PLT 250 12/16/2023      Latest Ref Rng & Units 12/15/2023    4:57 PM  CMP  Glucose 70 - 99 mg/dL 60   BUN 6 - 20 mg/dL <5   Creatinine 0.98 - 1.00 mg/dL 1.19   Sodium 147 - 829 mmol/L 140   Potassium 3.5 - 5.1 mmol/L 3.6   Chloride 98 - 111 mmol/L 107   CO2 22 - 32 mmol/L 19   Calcium 8.9 - 10.3 mg/dL 9.6   Total Protein 6.5 - 8.1 g/dL 6.6   Total Bilirubin 0.0 - 1.2 mg/dL 1.7   Alkaline Phos 38 - 126 U/L 242   AST 15 - 41 U/L 27   ALT 0 - 44 U/L 15    Edinburgh Score:    12/17/2023    2:00 PM  Edinburgh  Postnatal Depression Scale Screening Tool  I have been able to laugh and see the funny side of things. 0  I have looked forward with enjoyment to things. 0  I have blamed myself unnecessarily when things went wrong. 0  I have been anxious or worried for no good reason. 0  I have felt scared or panicky for no good reason. 0  Things have been getting on top of me. 1  I have been so unhappy that I have had difficulty sleeping. 0  I have felt sad or miserable. 0  I have been so unhappy that I have been crying. 0  The thought of harming myself has occurred to me. 0  Edinburgh Postnatal Depression Scale Total 1      After visit meds:  Allergies as of 12/18/2023       Reactions   Ancef [cefazolin] Hives   Immediately following administration of Ancef 2g during c-section, patient developed a few hives around her lips that resolved with IV benadryl.         Medication List     STOP taking these medications    acetaminophen 325 MG tablet Commonly known as: Tylenol   albuterol 108 (90 Base) MCG/ACT inhaler Commonly known as: VENTOLIN HFA   Diclegis 10-10 MG Tbec Generic drug: Doxylamine-Pyridoxine   famotidine 20 MG tablet Commonly known as: PEPCID   fluticasone 110 MCG/ACT inhaler Commonly known as: FLOVENT HFA   fluticasone 44 MCG/ACT inhaler Commonly known as: Flovent HFA   glycopyrrolate 1 MG tablet Commonly known as: Robinul   ondansetron 4 MG disintegrating tablet Commonly known as: ZOFRAN-ODT   prenatal multivitamin Tabs tablet   Transderm-Scop 1 MG/3DAYS Generic drug: scopolamine       TAKE these medications    ibuprofen 600 MG tablet Commonly known as: ADVIL Take 1 tablet (600 mg total) by mouth every 6 (six) hours as needed.   oxyCODONE 5 MG immediate release tablet Commonly known as: Roxicodone Take 1 tablet (5 mg total) by mouth every 4 (four) hours as needed.               Discharge Care Instructions  (From admission, onward)            Start     Ordered   12/18/23 0000  Discharge wound care:       Comments: For a cesarean delivery: You may wash incision with soap and water.  Do not soak or submerge the incision for 2 weeks. Keep incision dry. You may need to keep  a sanitary pad or panty liner between the incision and your clothing for comfort and to keep the incision dry. If you note drainage, increased pain, or increased redness of the incision, then please notify your physician.   12/18/23 1102   12/18/23 0000  If the dressing is still on your incision site when you go home, remove it on the third day after your surgery date. Remove dressing if it begins to fall off, or if it is dirty or damaged before the third day.       Comments: For a cesarean delivery   12/18/23 1102             Discharge home in stable condition Infant Feeding: Bottle and Breast Infant Disposition:home with mother Discharge instruction: per After Visit Summary and Postpartum booklet. Activity: Advance as tolerated. Pelvic rest for 6 weeks.  Diet: routine diet Anticipated Birth Control: Unsure Postpartum Appointment:4 weeks Additional Postpartum F/U: Future Appointments:No future appointments. Follow up Visit:  Follow-up Information     Ob/Gyn, Nestor Ramp Follow up in 4 week(s).   Why: For a postpartum evaluation Contact information: [address] [place] [address] [phone]                     12/18/2023 Waynard Reeds, MD

## 2023-12-18 NOTE — Lactation Note (Signed)
 This note was copied from a baby's chart. Lactation Consultation Note  Patient Name: Sarah [name] Boot RUEAV'W Date: 12/18/2023 Age:27 hours Reason for consult: Follow-up assessment;Maternal discharge;Early term 37-38.6wks  37 wks- Twins. DC a possibility today. Mom has DEBP set up, 2 ounces pumped on bedside- praised mom- doing great. LC services and milk storage shared. Hand pump assembled, mom uses well, easy flowing colostrum easily hand pumped to relieve breast pressure from right breast. Discussed DEBP- changing from initiate cycles to maintain cycles, and choosing pump strength. Discussed flange fit and re-evaluating with breast/hormone changes- currently fits nicely into the 21mm.  Encouraged mom to keep working on big mouth latch with baby and use EBM or coconut oil- provided- after each feed. Mom shares babies don't like breast as much- discussed differences between breast and bottle, baby looking for faster flow. Encouraged starting with hand expression and holding breast compression during feed- to push more milk and fat to baby, keeps baby working and happy @ breast. Discussed cluster feeding overnight/ early morning brings in our milk supply- throw in an extra pumping @ night to simulate this, shared expectations of milk coming in. Highlighted risk of engorgement. Discussed hand pump/express to soften breasts, motrin as anti-inflammatory, and ice packs for 10-20 minutes post feed/pumping if still over-full is the best treatments for inflamed/engorged breasts. Highlighted hand pump best tool for moving milk from engorged breast.  Feeding Mother's Current Feeding Choice: Breast Milk and Formula   Lactation Tools Discussed/Used Tools: Pump;Coconut oil Flange Size: 21 Breast pump type: Manual;Double-Electric Breast Pump (Demonstrated creating/using hand pump from breast pump kit parts) Pump Education: Milk Storage Reason for Pumping: Breast stimulation/ movement of milk/ 37 wk  twins Pumping frequency: Every 3 hrs Pumped volume: 60 mL  Interventions Interventions: Breast feeding basics reviewed;Hand express;Pre-pump if needed;Breast compression;Expressed milk;Coconut oil;Hand pump;DEBP;Education;LC Services brochure;CDC milk storage guidelines  Discharge Discharge Education: Engorgement and breast care Pump: Personal (Per mom has a Medela pump @ home)  Consult Status Consult Status: Complete Date: 12/18/23    [name] 12/18/2023, 11:42 AM

## 2023-12-18 NOTE — Discharge Instructions (Signed)
 WHAT TO LOOK OUT FOR: Fever of 100.4 or above Mastitis: feels like flu and breasts hurt Infection: increased pain, swelling or redness Blood clots golf ball size or larger Postpartum depression   Congratulations on your newest addition!

## 2023-12-18 NOTE — Lactation Note (Signed)
 This note was copied from a baby's chart. Lactation Consultation Note  Patient Name: Sarah [name] Allender WUJWJ'[id number] Date: 12/18/2023  Reason for consult: Maternal discharge;Early term 37-38.6wks  37 wks- Twins. DC a possibility today. Mom has DEBP set up, 2 ounces pumped on bedside- praised mom- doing great. LC services and milk storage shared. Hand pump assembled, mom uses well, easy flowing colostrum easily hand pumped to relieve breast pressure from right breast. Discussed DEBP- changing from initiate cycles to maintain cycles, and choosing pump strength. Discussed flange fit and re-evaluating with breast/hormone changes- currently fits nicely into the 21mm.  Encouraged mom to keep working on big mouth latch with baby and use EBM or coconut oil- provided- after each feed. Mom shares babies don't like breast as much- discussed differences between breast and bottle, baby looking for faster flow. Encouraged starting with hand expression and holding breast compression during feed- to push more milk and fat to baby, keeps baby working and happy @ breast. Discussed cluster feeding overnight/ early morning brings in our milk supply- throw in an extra pumping @ night to simulate this, shared expectations of milk coming in. Highlighted risk of engorgement. Discussed hand pump/express to soften breasts, motrin as anti-inflammatory, and ice packs for 10-20 minutes post feed/pumping if still over-full is the best treatments for inflamed/engorged breasts. Highlighted hand pump best tool for moving milk from engorged breast.  Feeding Mother's Current Feeding Choice: Breast Milk and Formula  Discharge Discharge Education: Engorgement and breast care  Consult Status Consult Status: Complete Date: 12/18/23    [name] 12/18/2023, 2:28 PM

## 2023-12-19 LAB — SURGICAL PATHOLOGY

## 2023-12-22 ENCOUNTER — Ambulatory Visit (HOSPITAL_COMMUNITY): Payer: Self-pay

## 2023-12-22 NOTE — Lactation Note (Signed)
 This note was copied from a baby's chart. Lactation Consultation Note  Patient Name: Sarah [name] ZOXWR'U Date: 12/22/2023 Age:27 days Reason for consult: Initial assessment;Multiple gestation;Early term 37-38.6wks;Infant < 6lbs  LC in to consult with P2 Mom of 7 day old baby "Phineas Semen" who was discharged on 3/3 and readmitted to [facility] ICU on 3/4 when PCP checked rectal temp of 95.  Baby was also tachycardic.  Baby has been on IV fluids and had numerous blood tests that have come back negative.  Blood glucose was 62 on admission.  Baby placed on 24 cal formula   Mom has pumped twice in two days using a hand pump only.  Mom has a DEBP at home.    Babies were discharged without lactation F/U and Mom is wanting help.  Message sent to [facility] RN IBCLC and [facility] lactation team.  Mom set up with a pump and assisted to pump on maintain mode and a pumping top.  Mom expressed 135 ml.  Education on the importance of consistent pumping (every 3 hrs) due to babies not being mature and big enough at 4 and 5 lbs to support a full milk supply.  Mom provided with powdered Neosure and given recipe to fortify her milk.  Mom has been breastfeeding [name] "Aila" at home.  Encouraged this, but not as a full meal.  Not until a weighted feed with OP lactation can verify baby's ability to transfer well at the breast.  Plan recommended by Lactation Consultant- 1- STS with babies 2- Feed babies at least every 3 hrs, sooner if they are showing feeding cues. 3- Offer the breast, and always supplement by paced bottle per volume Ped recommends.  Use fortified EBM (handout 90 ml and 1 tsp of Neosure powder) 4- Pump both breasts at each feeding, pumping for 20-30 mins. 5- F/U with OP lactation appt.    Lactation Tools Discussed/Used Tools: Pump;Flanges;Hands-free pumping top;Bottle Flange Size: 24 Breast pump type: Double-Electric Breast Pump Pump Education: Setup, frequency, and cleaning;Milk Storage Reason  for Pumping: support milk supply,ET twins, <6 lbs Pumping frequency: Mom has been pumping irregularly, last time was 2 days ago Pumped volume: 135 mL  Interventions Interventions: Skin to skin;Breast massage;Hand express;DEBP  Discharge Discharge Education: Engorgement and breast care;Outpatient recommendation;Outpatient Epic message sent  Consult Status Consult Status: Complete Date: 12/22/23 Follow-up type: Out-patient    Judee Clara 12/22/2023, 4:55 PM

## 2023-12-23 ENCOUNTER — Telehealth (HOSPITAL_COMMUNITY): Payer: Self-pay | Admitting: *Deleted

## 2023-12-23 NOTE — Telephone Encounter (Signed)
 12/23/2023  Name: ALYCE INSCORE MRN: [id number] DOB: 15-Sep-1996  Reason for Call:  Transition of Care Hospital Discharge Call  Contact Status: Patient Contact Status: Complete  Language assistant needed: Interpreter Mode: Interpreter Not Needed        Follow-Up Questions: Do You Have Any Concerns About Your Health As You Heal From Delivery?: Yes What Concerns Do You Have About Your Health?: Patient said she had some nausea and vomiting while her newborn son was readmitted in the PICU.  He is home now and well.  She says that she is feeling better, too.  Encouraged patient to call OB if nausea/vomiting persist as this would not be normal at this stage of her healing. Do You Have Any Concerns About Your Infants Health?: No  Edinburgh Postnatal Depression Scale:  In the Past 7 Days: I have been able to laugh and see the funny side of things.: As much as I always could I have looked forward with enjoyment to things.: As much as I ever did I have blamed myself unnecessarily when things went wrong.: No, never I have been anxious or worried for no good reason.: Hardly ever I have felt scared or panicky for no good reason.: No, not at all Things have been getting on top of me.: No, I have been coping as well as ever I have been so unhappy that I have had difficulty sleeping.: Not at all I have felt sad or miserable.: No, not at all I have been so unhappy that I have been crying.: Yes, quite often The thought of harming myself has occurred to me.: Never Edinburgh Postnatal Depression Scale Total: 3  PHQ2-9 Depression Scale:     Discharge Follow-up: Edinburgh score requires follow up?: No Patient was advised of the following resources:: Support Group, Breastfeeding Support Group  Post-discharge interventions: Reviewed Newborn Safe Sleep Practices  Salena Saner, RN 12/23/2023 14:33

## 2024-01-12 DIAGNOSIS — Z1331 Encounter for screening for depression: Secondary | ICD-10-CM | POA: Diagnosis not present

## 2024-03-21 ENCOUNTER — Telehealth: Payer: Self-pay | Admitting: Lactation Services

## 2024-03-21 NOTE — Telephone Encounter (Signed)
 Returning voice mail requesting call back regarding milk supply decreased,  States Bishop Bullock latching more and Cristal Don is latching in a specific position. States unsure why milk supply has decreased. Reports school, not hungry, cycle about to come on, and increased stress, and not eating as much as she should.  Latching 2 x a day total and pumping 4 x a day total, most able to remove is 1/2-1 1/2 ounces total. States period returned last month and is about to return. Reports at the Doctors office and would like a call back after 2 pm if possible to review milk supply challenges.  Orlie Bjornstad IBCLC

## 2024-03-22 ENCOUNTER — Telehealth: Payer: Self-pay | Admitting: Lactation Services

## 2024-03-22 NOTE — Telephone Encounter (Signed)
 Calling Duyen to follow up with milk production decreasing concerns. Called once, no answer, unable to leave message. Follow up as needed. Orlie Bjornstad IBCLC

## 2024-03-22 NOTE — Telephone Encounter (Signed)
 Mom returned call from [place], [facility] earlier in regards to milk supply.   Returned call to patient. She reports: milk supply has decreased. She does not pump overnight and infants are sleeping longer at night. She is in nursing school and feels high stress.   She has a wearable pump and plug in one also. Reviewed increasing pumping to 8-12 times a day to provide enough stimulation to promote milk supply. She is using a # 17 flange and feels it fits well.   She predoiminately drinks water  and drinks a lot. She is adding body armor. She is using Fenugreek supplements, she reports she sometimes feels it helps and sometimes does not. She has purchased a new bottle. It is 565 capsules. She was taking 3 capsules 3 times a day. She is planning to restart.   She is noting her supply dropping with her cycle. Reviewed trying a Ca/Mg supplement ( 1000 mg Ca/500 mg Mg) to prevent milk supply decreasing with cycle.   Reviewed Reglan  dosage, side effects, and will need to speak with OB about Rx.   Milk supply decrease most likely related to inadequate stimulation and breast emptying.

## 2024-06-08 DIAGNOSIS — F418 Other specified anxiety disorders: Secondary | ICD-10-CM | POA: Diagnosis not present

## 2024-06-08 DIAGNOSIS — R41840 Attention and concentration deficit: Secondary | ICD-10-CM | POA: Diagnosis not present

## 2024-07-09 DIAGNOSIS — F418 Other specified anxiety disorders: Secondary | ICD-10-CM | POA: Diagnosis not present

## 2024-07-09 DIAGNOSIS — R41840 Attention and concentration deficit: Secondary | ICD-10-CM | POA: Diagnosis not present

## 2024-07-10 DIAGNOSIS — F900 Attention-deficit hyperactivity disorder, predominantly inattentive type: Secondary | ICD-10-CM | POA: Diagnosis not present

## 2024-07-30 DIAGNOSIS — F900 Attention-deficit hyperactivity disorder, predominantly inattentive type: Secondary | ICD-10-CM | POA: Diagnosis not present

## 2024-08-03 DIAGNOSIS — Z23 Encounter for immunization: Secondary | ICD-10-CM | POA: Diagnosis not present

## 2024-08-08 DIAGNOSIS — J4520 Mild intermittent asthma, uncomplicated: Secondary | ICD-10-CM | POA: Diagnosis not present

## 2024-08-08 DIAGNOSIS — F418 Other specified anxiety disorders: Secondary | ICD-10-CM | POA: Diagnosis not present

## 2024-08-09 DIAGNOSIS — F900 Attention-deficit hyperactivity disorder, predominantly inattentive type: Secondary | ICD-10-CM | POA: Diagnosis not present

## 2024-08-15 DIAGNOSIS — F900 Attention-deficit hyperactivity disorder, predominantly inattentive type: Secondary | ICD-10-CM | POA: Diagnosis not present

## 2024-09-17 DIAGNOSIS — F900 Attention-deficit hyperactivity disorder, predominantly inattentive type: Secondary | ICD-10-CM | POA: Diagnosis not present
